# Patient Record
Sex: Male | Born: 1945 | Race: White | Hispanic: No | Marital: Married | Smoking: Former smoker
Health system: Southern US, Community
[De-identification: ages and names within clinical notes are randomized; demographics above are authoritative.]

## PROBLEM LIST (undated history)

## (undated) DIAGNOSIS — Z720 Tobacco use: Secondary | ICD-10-CM

## (undated) DIAGNOSIS — K219 Gastro-esophageal reflux disease without esophagitis: Secondary | ICD-10-CM

## (undated) DIAGNOSIS — I2510 Atherosclerotic heart disease of native coronary artery without angina pectoris: Principal | ICD-10-CM

## (undated) DIAGNOSIS — E119 Type 2 diabetes mellitus without complications: Secondary | ICD-10-CM

## (undated) DIAGNOSIS — E782 Mixed hyperlipidemia: Secondary | ICD-10-CM

## (undated) DIAGNOSIS — I739 Peripheral vascular disease, unspecified: Secondary | ICD-10-CM

## (undated) DIAGNOSIS — K5792 Diverticulitis of intestine, part unspecified, without perforation or abscess without bleeding: Secondary | ICD-10-CM

## (undated) DIAGNOSIS — I10 Essential (primary) hypertension: Secondary | ICD-10-CM

## (undated) DIAGNOSIS — E1140 Type 2 diabetes mellitus with diabetic neuropathy, unspecified: Secondary | ICD-10-CM

## (undated) DIAGNOSIS — H269 Unspecified cataract: Secondary | ICD-10-CM

## (undated) DIAGNOSIS — M1990 Unspecified osteoarthritis, unspecified site: Secondary | ICD-10-CM

## (undated) HISTORY — PX: OTHER SURGICAL HISTORY: SHX169

## (undated) HISTORY — PX: KNEE SURGERY: SHX244

---

## 2001-05-28 ENCOUNTER — Inpatient Hospital Stay (HOSPITAL_COMMUNITY): Admission: AD | Admit: 2001-05-28 | Discharge: 2001-05-31 | Payer: Self-pay | Admitting: Cardiology

## 2002-03-26 ENCOUNTER — Encounter: Payer: Self-pay | Admitting: Emergency Medicine

## 2002-03-26 ENCOUNTER — Inpatient Hospital Stay (HOSPITAL_COMMUNITY): Admission: RE | Admit: 2002-03-26 | Discharge: 2002-03-27 | Payer: Self-pay | Admitting: Internal Medicine

## 2003-07-14 ENCOUNTER — Inpatient Hospital Stay (HOSPITAL_BASED_OUTPATIENT_CLINIC_OR_DEPARTMENT_OTHER): Admission: RE | Admit: 2003-07-14 | Discharge: 2003-07-14 | Payer: Self-pay | Admitting: Cardiology

## 2004-04-08 HISTORY — PX: LEG AMPUTATION ABOVE KNEE: SHX117

## 2004-12-24 ENCOUNTER — Encounter (INDEPENDENT_AMBULATORY_CARE_PROVIDER_SITE_OTHER): Payer: Self-pay | Admitting: *Deleted

## 2004-12-24 ENCOUNTER — Ambulatory Visit: Payer: Self-pay | Admitting: Physical Medicine & Rehabilitation

## 2004-12-24 ENCOUNTER — Inpatient Hospital Stay (HOSPITAL_COMMUNITY): Admission: EM | Admit: 2004-12-24 | Discharge: 2005-01-04 | Payer: Self-pay | Admitting: *Deleted

## 2004-12-25 ENCOUNTER — Encounter: Payer: Self-pay | Admitting: Cardiology

## 2004-12-25 ENCOUNTER — Ambulatory Visit: Payer: Self-pay | Admitting: Cardiology

## 2004-12-26 ENCOUNTER — Encounter (INDEPENDENT_AMBULATORY_CARE_PROVIDER_SITE_OTHER): Payer: Self-pay | Admitting: *Deleted

## 2005-01-01 ENCOUNTER — Encounter: Payer: Self-pay | Admitting: Cardiology

## 2005-01-04 ENCOUNTER — Inpatient Hospital Stay (HOSPITAL_COMMUNITY)
Admission: RE | Admit: 2005-01-04 | Discharge: 2005-01-10 | Payer: Self-pay | Admitting: Physical Medicine & Rehabilitation

## 2005-01-18 ENCOUNTER — Ambulatory Visit: Payer: Self-pay | Admitting: Physical Medicine & Rehabilitation

## 2005-01-18 ENCOUNTER — Inpatient Hospital Stay (HOSPITAL_COMMUNITY): Admission: AD | Admit: 2005-01-18 | Discharge: 2005-01-25 | Payer: Self-pay | Admitting: Vascular Surgery

## 2005-01-21 ENCOUNTER — Encounter (INDEPENDENT_AMBULATORY_CARE_PROVIDER_SITE_OTHER): Payer: Self-pay | Admitting: Specialist

## 2005-01-28 ENCOUNTER — Inpatient Hospital Stay (HOSPITAL_COMMUNITY): Admission: EM | Admit: 2005-01-28 | Discharge: 2005-02-14 | Payer: Self-pay | Admitting: Emergency Medicine

## 2005-01-28 ENCOUNTER — Ambulatory Visit: Payer: Self-pay | Admitting: Family Medicine

## 2005-04-05 ENCOUNTER — Ambulatory Visit: Payer: Self-pay | Admitting: Physical Medicine & Rehabilitation

## 2005-04-05 ENCOUNTER — Encounter
Admission: RE | Admit: 2005-04-05 | Discharge: 2005-07-04 | Payer: Self-pay | Admitting: Physical Medicine & Rehabilitation

## 2005-05-30 ENCOUNTER — Ambulatory Visit: Payer: Self-pay | Admitting: Physical Medicine & Rehabilitation

## 2005-07-10 ENCOUNTER — Encounter
Admission: RE | Admit: 2005-07-10 | Discharge: 2005-08-29 | Payer: Self-pay | Admitting: Physical Medicine & Rehabilitation

## 2005-08-07 ENCOUNTER — Ambulatory Visit: Payer: Self-pay | Admitting: Cardiology

## 2006-04-08 HISTORY — PX: CHOLECYSTECTOMY: SHX55

## 2007-03-31 IMAGING — CR DG HIP COMPLETE 2+V*R*
3 series · 3 of 3 positions shown · non-contrast
Comparison: none

CLINICAL DATA: Severe right hip pain with recent right leg amputation. 
 3-VIEW RIGHT HIP:

[t pelvis a.p.]
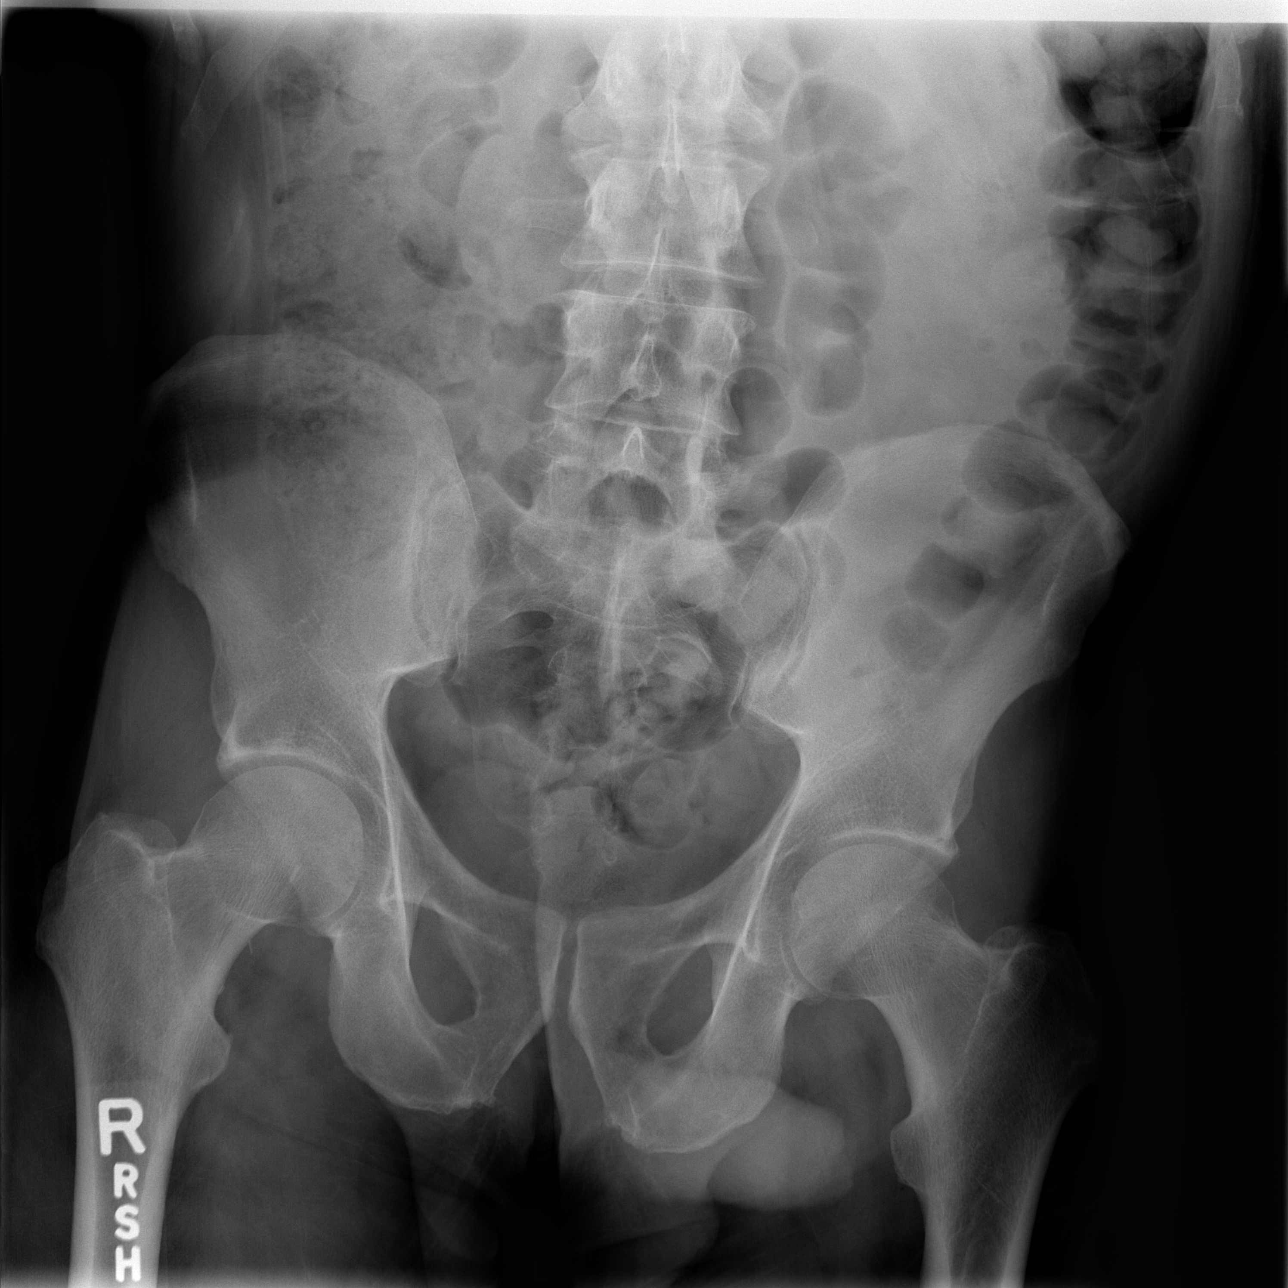

[t hip ap right]
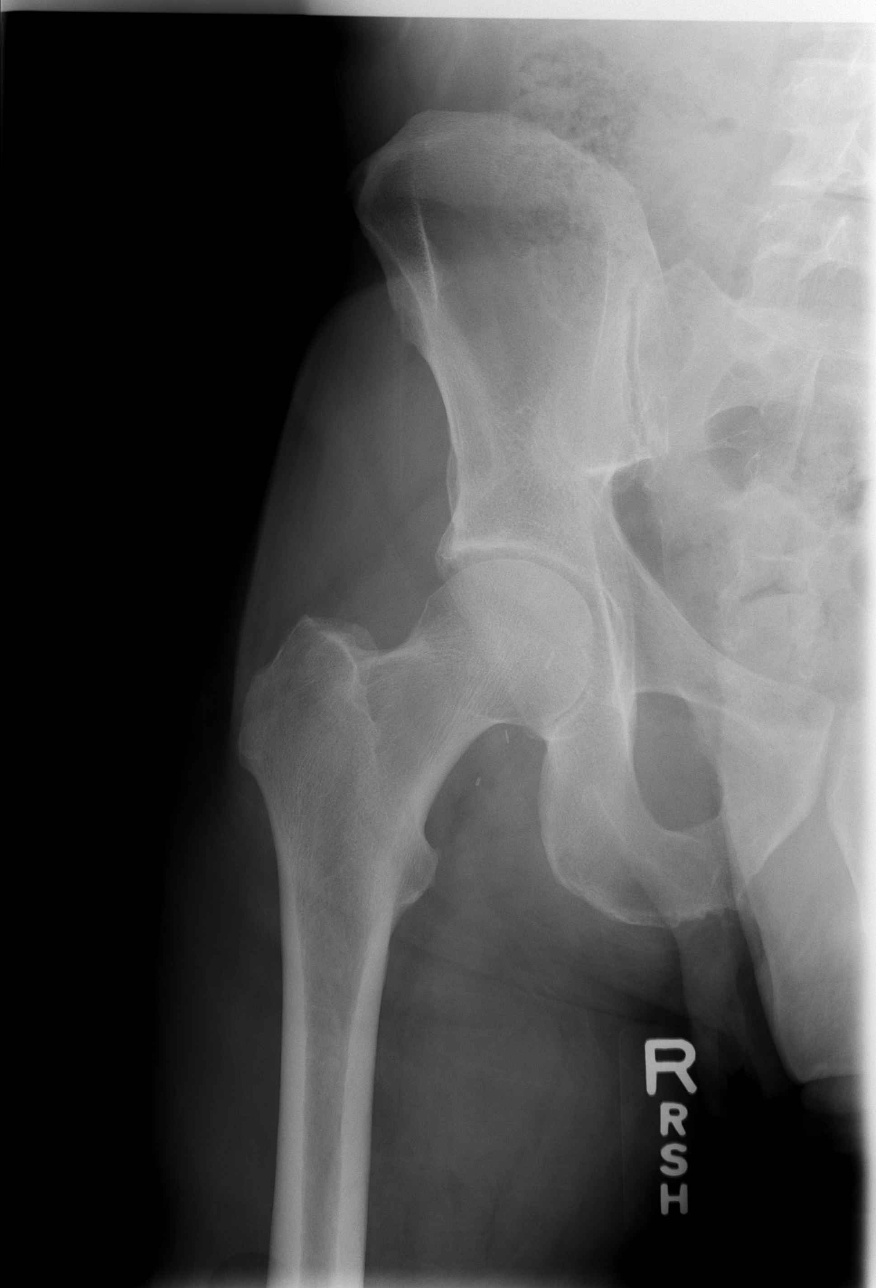

[t hip frog leg right]
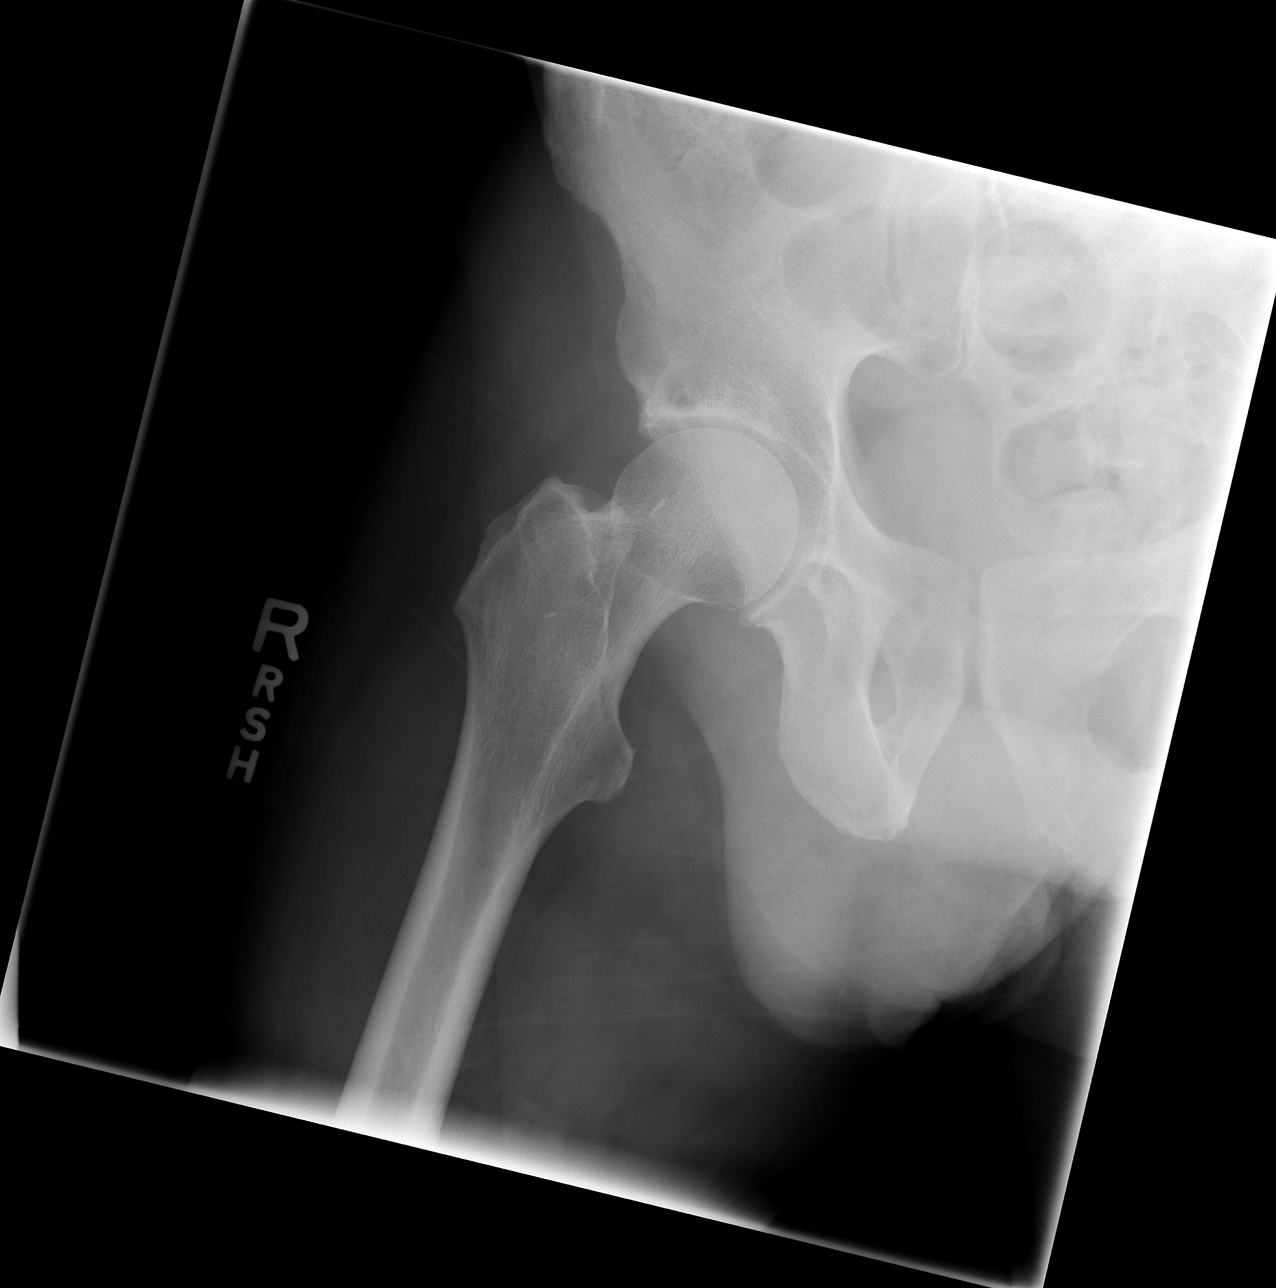

[3 of 3 positions shown; findings below may reference images not displayed]

FINDINGS: Hips are unremarkable.  Obturator rings are intact.
IMPRESSION: No acute findings.

## 2008-03-24 ENCOUNTER — Ambulatory Visit: Payer: Self-pay | Admitting: Cardiovascular Disease

## 2010-08-21 NOTE — Assessment & Plan Note (Signed)
[facility]                            CARDIOLOGY OFFICE NOTE   Marvin Nelson, Marvin Nelson                        MRN:          [id number]  DATE:03/24/2008                            DOB:          1946-01-18    REFERRED BY:  Wyvonnia Lora   Marvin Nelson is a 65 year old patient referred by Dr. Margo Common.  The patient  is from [place] and is previous seen by Dr. Diona Browner.  He was not quite  sure why he had to come down to [place], although Dr. Jackolyn Confer note  indicated that he wanted someone in [place] to be familiar with him,  as the patient needs oral surgery, as far as I know this is being done  by Dr. Barbette Merino, but it is an outpatient procedure.   The patient is on chronic Coumadin back in 2003.  He had an emboli to  the right lower extremity and has been on Coumadin since.  The patient  was off Coumadin in 2006 without a Lovenox overlap for gallbladder  surgery and had no sequela.  There is no documentation in e-chart  regarding any hypercoagulability state.  There was mention that the  patient had a TEE with no source of embolus.   The patient clinically has no previously documented coronary disease.  He did have a heart cath back in 2005 by Dr. Samule Ohm, which showed a  reversible defect which was thought to be spasm in the distal LAD, but  no fixed epicardial coronary disease.  He has had normal LV function.   Despite having a right above-knee amputation, the patient gets around  with a walker.  He has not had any significant chest pain, PND, or  orthopnea.   He has not had any other embolic events.   His review of systems is remarkable for somewhat sporadic control of his  diabetes, although he tells me his hemoglobin A1c is under 6.  He is not  having any significant palpitations, PND, or orthopnea.   His past medical history is remarkable for previous left knee surgery,  knee amputation in the right leg by Dr. Hart Rochester, and previous   cholecystectomy.   He denies any allergies.   MEDICATIONS:  1. Januvia 50 a day.  2. Metformin 500 b.i.d.  3. Glipizide 5 a day.  4. Amitriptyline 50 at bedtime.  5. Diovan hydrochlorothiazide 80/12.5.  6. Omeprazole 20 a day.  7. Gabapentin 300 two tablets 4 times a day.  8. An aspirin a day.  9. Meloxicam 15 a day.  10.Metoprolol 25 b.i.d.  11.Warfarin.   FAMILY HISTORY:  Noncontributory.   The patient is a previous alcoholic.  He is separated, but however, his  ex-wife was with him today and they seem to get along fine.  He has 1  child.  He is on disability.  He currently does not smoke or do drugs or  drink alcohol.   He quit smoking a few weeks ago or at least he says he did.   PHYSICAL EXAMINATION:  GENERAL:  Remarkable for disheveled male in no  distress.  VITAL SIGNS:  His blood pressure is 130/70, pulse is 80 and regular with  no evidence of fibrillation, weight is 146.  HEENT:  Unremarkable.  NECK:  Carotids are normal without bruit.  No lymphadenopathy,  thyromegaly, or JVP elevation.  LUNGS:  Clear with good diaphragmatic motion.  No wheezing.  CARDIAC:  S1 and S2 with the faint systolic murmur.  PMI normal.  ABDOMEN:  Benign.  Bowel sounds positive.  No AAA, no bruit, no bruit,  no hepatosplenomegaly, no hepatojugular reflux, or tenderness.  EXTREMITIES:  Distal pulses in the left leg are palpable.  He has no  obvious bruits.  He is status post right above-knee amputation on the  left.   Baseline EKG is normal.   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  At that time, the workup was apparently  negative including a TEE for source of embolus.  He has been off his  Coumadin already once before for cholecystectomy.  I see no reason why  he cannot come off his Coumadin and have his teeth pulled.  I do not  think he needs a Lovenox bridge for this.  He will make plans to go  ahead and do this with  Dr. Barbette Merino.  I think, we should take advantage of  his period of being off Coumadin as to check a hypercoagulability panel  including a protein S, protein C, factor V on Leiden, anticardiolipin,  and lupus anticoagulant panel.  This will allow [place] to possibly see  whether he really does need to be on Coumadin the rest of his life.  1. History of hypertension, currently well controlled.  Continue      current medications.  Low-sodium diet.  2. Diabetes.  Continue current oral hypoglycemics.  Hemoglobin A1c      seems to be in a good range.  3. History of arthritis.  Continue meloxicam.  4. History of reflux.  Continue omeprazole.  5. 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  Continue      amitriptyline.   The patient has previously been seen by Dr. Diona Browner.  He wants to be  followed in [place].  We will make arrangements for him to get his  hypercoagulability panel after his teeth is being pulled in [place] and  have him follow up with Dr. Diona Browner in [place].     Noralyn Pick. Eden Emms, MD, [facility]  Electronically Signed    PCN/MedQ  DD: 03/24/2008  DT: 03/25/2008  Job #: [id number]

## 2010-08-24 NOTE — Consult Note (Signed)
NAME:  Marvin Nelson, Marvin Nelson                           ACCOUNT NO.:  [id number]   MEDICAL RECORD NO.:  [id number]                   PATIENT TYPE:  INP   LOCATION:  A210                                 FACILITY:  [facility]   PHYSICIAN:  [place] Bing, M.D. [facility]           DATE OF BIRTH:  Apr 04, 1946   DATE OF CONSULTATION:  03/26/2002  DATE OF DISCHARGE:                                   CONSULTATION   PRIMARY CARDIOLOGIST:  Jonelle Sidle, M.D. [facility]   PRIMARY PHYSICIAN:  Zigmund Gottron, M.D.   HISTORY OF PRESENT ILLNESS:  A 65 year old gentleman with known mild  coronary artery disease presents with recurrent chest pain and left arm  pain.  The patient's cardiac history dates to earlier this year when he  presented with episodic left arm pain that radiated to his shoulder and to  the chest.  A stress Cardiolite study was negative for ischemia.  Coronary  angiography revealed mild to moderate LAD disease including a 70% distal  lesion and a 50% proximal RCA lesion.  The left ventricular systolic  function was normal.  Since that time, he has continued to have episodic  left arm and chest discomfort, some episodes being moderately severe.  Some  of his description is fairly good for anginal discomfort.  Episode generally  occur a few times per month and are promptly relieved with sublingual  nitroglycerin.  While awaiting upper and lower endoscopy today, he noted the  onset of his typical symptoms.  He was transferred to the emergency  department where discomfort was relieved by sublingual nitroglycerin.  He is  now admitted to rule out myocardial infarction.   The patient has significant vascular risk factors including diabetes,  hypertension, and hyperlipidemia.  There is also a history of COPD.   SOCIAL HISTORY:  He lives in [place] with his wife; disabled from work as a  Surveyor, minerals.  He is married with no children.  He has a 40-pack-year of  cigarette smoking that was discontinued one year  ago.  There is a remote  history of alcohol abuse.   FAMILY HISTORY:  Mother had diabetes; father had a fatal myocardial  infarction in his 39s.   REVIEW OF SYSTEMS:  He does have some DJD with arthralgias in the shoulder,  neck, and back.  He had lately had abdominal pain for which Dr. Jena Gauss is  currently evaluating him.  All other symptoms negative.   CURRENT MEDICATIONS:  1. Metformin 1500 mg q.d.  2. Celebrex 200 mg q.d.  3. Triamterene/HCTZ 1 q.d.  4. Atenolol 100 mg q.d.  5. Prevacid 30 mg q.d.  6. Aspirin 81 mg q.d.  7. Welchol 3.75 g q.d.  8. Allegra 180 mg q.d.  9. Nitroglycerin p.r.n.   PHYSICAL EXAMINATION:  GENERAL:  A pleasant well-appearing gentleman.  VITAL SIGNS:  The heart rate is 70 and regular, respirations 16, blood  pressure 150/70.  NECK:  No jugular venous distention; no carotid bruits.  ENDOCRINE:  No thyromegaly.  SKIN:  No significant lesions.  HEENT:  Anicteric sclerae.  LUNGS:  Clear.  CARDIAC:  Normal first and second heart sounds.  ABDOMEN:  Soft and nontender; no organomegaly.  EXTREMITIES:  Distal pulses intact; no edema.  NEUROMUSCULAR:  Symmetric strength and tone.   LABORATORY DATA:  EKG with sinus bradycardia; nonspecific T-wave  abnormality.   Initial laboratory unremarkable including CPK-MB of 2.8 and troponin of  0.02.   IMPRESSION:  The patient has mild coronary disease with symptoms that are  not clearly related to myocardial ischemia.  The episode that occurred this  morning is no different than multiple episodes that have been treated at  home in the past.  If cardiac markers remain negative, and the EKG is  unchanged in the morning, he can be discharged without further evaluation.  I agree with the decision to add amlodipine to his medical regimen.  If this  is ineffective, isosorbide mononitrate would be my next choice.  Consideration could be given to chronic treatment with an ACE inhibitor in  light of his coronary disease  and diabetes as well.  I have suggested that  he schedule an appointment with Dr. Diona Browner in approximately one month for  clinical reevaluation and further adjustment of medication.   We greatly appreciate the opportunity afforded to [place] to reassess this nice  gentleman and assist in his care in hospital.                                               [place] Bing, M.D. [facility]    RR/MEDQ  D:  03/26/2002  T:  03/27/2002  Job:  [id number]

## 2010-08-24 NOTE — Assessment & Plan Note (Signed)
INTERVAL HISTORY:  Marvin Nelson is back regarding his right above-knee amputation.  He is back for wound followup.  The wound has healed up nicely.  We had  recommended some wet-to-dry dressings.  Dr. Darrick Penna gave him a positive  checkup as well recently.  He is still having some left leg pain from the  back into the posterior thigh and calf.  The phantom pain is improving.  Overall he rates his pain at 5/10 described as burning and sharp.  The  patient remains on Neurontin 200 mg twice daily and 600 mg at bedtime as  well as Elavil 50 mg at bedtime.  The patient is eager to be fitted with his  prosthetic leg.   REVIEW OF SYSTEMS:  The patient reports weight gain, easy bleeding, labile  sugars.  Otherwise pertinent positives are listed above and full review is  in the health and history section of the chart.   SOCIAL HISTORY:  The patient is married and living with his spouse.   PHYSICAL EXAMINATION:  Blood pressure is 125/70, pulse 82, respiratory rate  16, he is saturating 97% in room air.  The patient is pleasant.  No acute  distress.  He is alert, oriented x3.  He is hopping on his walker and doing  fairly well.  The patient had some pain with palpation of the left lumbar  spine.  Seated slump test was equivocal.  Right leg was clean and generally  intact.  I did press deep into the invaginated area of the distal limb which  appeared to be granulated.  There was a small area that bled minimally with  deep palpation.  No purulent material or discharge was noted.  Heart was  regular rate and rhythm.  Lungs are clear.  Abdomen soft, nontender.   ASSESSMENT:  1.  Right above-knee amputation.  2.  Diabetes.  3.  Left leg pain consistent with lumbar radiculopathy.   PLAN:  1.  Even though there is a tiny area that had some bloody discharge I think      he is appropriate to begin fitting of his prosthesis.  He will need to      watch the leg closely in the socket for tolerance.  There is no  dressing      needed currently other than keeping the area clean.  2.  We will follow up with the patient in approximately 2 months' time.  I      will send him to therapy once he is fitted for this limb.  Consider      workup for his back depending on how he does once he begins walking on      his right prosthetic limb.      Ranelle Oyster, M.D.  Electronically Signed     ZTS/MedQ  D:  05/31/2005 11:54:18  T:  05/31/2005 19:39:28  Job #:  [id number]   cc:   Janetta Hora. Fields, MD  [address], [place] [address]

## 2010-08-24 NOTE — H&P (Signed)
Marvin Nelson, Marvin Nelson                 ACCOUNT NO.:  [id number]   MEDICAL RECORD NO.:  [id number]          PATIENT TYPE:  EMS   LOCATION:  MAJO                         FACILITY:  [facility]   PHYSICIAN:  Quita Skye. Hart Rochester, M.D.  DATE OF BIRTH:  Dec 02, 1945   DATE OF ADMISSION:  12/24/2004  DATE OF DISCHARGE:                                HISTORY & PHYSICAL   CHIEF COMPLAINT:  Right foot pain.   HISTORY OF PRESENT ILLNESS:  This is a 65 year old male with acute  presentation to [facility] with pain in his right foot.  Approximately 11:30 today, he states he had acute episode while doing some  yard work where the foot became quite painful.  Additionally, there were  symptoms of numbness, increasing coldness, and some tingling sensation.  He  maintained gross motor and sensation to the foot but presented to the  [facility] where initial impression was profound ischemia to the  right lower extremity.  Vascular surgical opinion was requested and  telephone consultation with Dr. Hart Rochester, and he recommended transfer to [facility] for further evaluation and treatment.   PAST MEDICAL HISTORY:  1.  Hypertension.  2.  Diabetes mellitus, type 2.  3.  Chronic low back pain.  4.  Coronary artery disease.  He has had some kind of previous procedure,      but he is not sure if it was a stent or angioplasty or just      catheterization.  5.  Tobacco abuse.   PAST SURGICAL HISTORY:  Status post left knee arthroscopy.   ALLERGIES:  No known drug allergies.   SOCIAL HISTORY:  He is a long-term smoker of approximately 50 years.  He  smokes about 1 pack per day.  He quit at one time for approximately 8 years  duration.  Alcohol use is none for the past 13 years.  He has drunk heavy in  the past. Drug use: None recently.  He admits to remote marijuana use.   FAMILY HISTORY:  Noncontributory.   MEDICATIONS:  1.  Diclofenac 75 mg twice daily.  2.  Welchol 625 mg 3 tablets twice  daily.  3.  Glucophage 2000 mg daily.  4.  Neurontin 300 mg in the a.m., 300 mg at noon, and 600 mg at bedtime.  5.  Prevacid 30 mg daily.  6.  Amitriptyline 50 mg nightly.  7.  Diovan 160/25 mg 1 daily.  8.  Aspirin 81 mg daily.   REVIEW OF SYMPTOMS:  Remarkable for shortness of breath and dyspnea on  exertion.  He denies frank chest pain, palpitations, pedal edema, PND, or  orthopnea.  He does have a history of hypertension.  Review of Symptoms is  otherwise unremarkable on full system review, although he does admit to  chronic low back pain that is quite disabling.   PHYSICAL EXAMINATION:  GENERAL: He is alert and in no acute distress.  HEENT:  Pupils equal, round, and reactive to light.  Extraocular movements  intact. Sclerae anicteric.  Pharynx is clear without exudates or erythema.  He  is nearly edentulous.  He does have a couple of teeth in fair repair.  NECK:  Supple.  No jugular venous distention, no bruits, no adenopathy, no  thyromegaly.  PULMONARY:  Clear to auscultation without rales, rhonchi, or wheeze.  CARDIAC:  Regular rate and rhythm.  S1, S2 without murmurs, gallops, or  rubs.  ABDOMEN:  Soft, nontender.  No masses, no bruits, no organomegaly.  EXTREMITIES:  The right foot is cool, pale, pulseless.  The left foot has a  posterior tibial pulse.  He has a weak right femoral pulse.  He has a strong  left femoral pulse. Gross motor and sensory exam is intact.  He does move  his toes, but the foot is cool to approximately the level of the ankle.  NEUROLOGIC:  Otherwise nonfocal.  SKIN:  Normal turgor, no lesions.  The right foot is pale.   ASSESSMENT:  Acute right lower extremity ischemia.   PLAN:  Arteriogram and then possible operative intervention pending findings  during the arteriogram.      Rowe Clack, P.A.-C.    ______________________________  Quita Skye. Hart Rochester, M.D.    Sherryll Burger  D:  12/24/2004  T:  12/24/2004  Job:  [id number]

## 2010-08-24 NOTE — Discharge Summary (Signed)
Marvin Nelson, Marvin Nelson                 ACCOUNT NO.:  [id number]   MEDICAL RECORD NO.:  [id number]          PATIENT TYPE:  IPS   LOCATION:  4037                         FACILITY:  [facility]   PHYSICIAN:  Ranelle Oyster, M.D.DATE OF BIRTH:  18-Sep-1945   DATE OF ADMISSION:  01/04/2005  DATE OF DISCHARGE:  01/10/2005                                 DISCHARGE SUMMARY   DISCHARGE DIAGNOSES:  1.  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  Pain management.  3.  Chronic Coumadin therapy for peripheral vascular disease.  4.  Hypertension.  5.  Type 2 diabetes mellitus.  6.  Hyperlipidemia.  7.  Coronary artery disease.  8.  Chronic low back pain.  9.  Tobacco abuse.  10. Hyponatremia, resolved.   HISTORY OF PRESENT ILLNESS:  A 65 year old white male admitted to [facility] with acute right foot pain, profound ischemic changes.  Angiogram  showed a large embolus in the terminal aorta partially obstructing both  iliac anterior with no flow down any of the tibial vessels below the knee.  Underwent bilateral iliofemoral embolectomies with direct embolectomy  December 24, 2004 by Dr. Hart Rochester with ultimate right below-knee amputation  same day.  Echocardiogram with ejection fraction 55-65% without thrombus.  Placed on Coumadin therapy after below-knee amputation for peripheral  vascular disease.   HOSPITAL COURSE:  With hyponatremia, 125, improved after hydrochlorothiazide  discontinued to 135.  Follow-up TEE after echocardiogram January 01, 2005  without findings of thrombus.  Noted fall on January 01, 2005 and  amputation site bleeding, cleaned, cleansed daily.  No surgical  intervention.  Admitted for a comprehensive rehabilitation program.   PAST MEDICAL HISTORY:  See discharge diagnoses.  Noted history of tobacco  abuse.  Remote alcohol as well as marijuana.   SOCIAL HISTORY:  He lives with his wife in [place].  Wife works day  shift.  The  patient on Disability.  Local family check as needed.  One level home, two  steps to entry.   ALLERGIES:  None.   MEDICATIONS PRIOR TO ADMISSION:  1.  Diclofenac 75 mg twice daily.  2.  Welchol 625 mg three tablets twice daily.  3.  Glucophage 2000 mg daily.  4.  Neurontin 300 mg twice daily and 600 mg at bedtime.  5.  Prevacid 30 mg daily.  6.  Elavil 50 mg at bedtime.  7.  Diovan 160 mg - 25 mg daily.  8.  Aspirin 81 mg daily.   HOSPITAL COURSE:  The patient with progressive gains while on rehabilitation  services with therapies initiated on a b.i.d. basis. The following issues  are followed during the patient's rehabilitation course:  Pertaining to Marvin Nelson's right below-knee amputation, surgical site healing nicely.  No signs  of infection.  He would follow up with Dr. Hart Rochester two weeks after discharge.  Pain control ongoing with the use of OxyContin Sustained Release 20 mg every  12 hours with taper is advised as well as oxycodone for breakthrough pain  and Neurontin.  He remained on chronic Coumadin therapy for peripheral  vascular disease secondary to thrombus that inevitably led to his below-knee  amputation.  He will continue on Coumadin therapy with no bleeding episodes.  Latest INR of 1.6 and Dr. Wyvonnia Lora to continue to follow.  His blood  pressures remained monitored with diastolic's 56-64 on multiple  antihypertensive medications of Toprol, Norvasc and Diovan.  His blood  sugars varied at 131, 121, 137 with Glucophage as well as diabetic diet.  He  had no chest pain or shortness of breath throughout his rehabilitation  course.  He did have a noted history of tobacco abuse with a nicotine patch  in place.  It was discussed at length the need for cessation smoking.  It  was questionable if he would be compliant with these requests.   Latest laboratories showed an INR of 1.6, hemoglobin of 11, hematocrit 31.7,  platelets 624,000.  Sodium 137, potassium  4.2, BUN 20, creatinine 1.0.  He  was ambulating short distances with a walker, needing minimal assist for  lower body activities of daily living.  Overall, his strength and endurance  had greatly improved as he was encouraged with his overall progress and  discharged to home.   DISCHARGE MEDICATIONS AT TIME OF DICTATION:  1.  Coumadin 6 mg daily.  2.  Neurontin 300 mg twice daily and 600 mg at bedtime.  3.  Prevacid 30 mg daily.  4.  Elavil 50 mg at bedtime.  5.  Aspirin 81 mg daily.  6.  Welchol twice daily.  7.  Toprol XL 25 mg daily.  8.  Glucophage 500 mg two tablets in the a.m., one tablet in the p.m.  9.  Norvasc 5 mg daily.  10. Diovan 160 mg daily.  11. OxyContin Sustained Release 10 mg two tablets twice daily x1 week, then      one tablet twice daily x1 week and stop.  12. Oxycodone Immediate Release for breakthrough pain.   DIET:  Diabetic diet.   He would follow with Dr. Riley Kill at the outpatient rehabilitation service  amputee clinic, Dr. Hart Rochester two weeks after discharge, Dr. Margo Common medical  management.  His Coumadin will continue to be followed by Dr. Margo Common of  [place], [facility] who will be contacted prior to the patient's departure  from the hospital.      Mariam Dollar, P.A.      Ranelle Oyster, M.D.  Electronically Signed    DA/MEDQ  D:  01/09/2005  T:  01/09/2005  Job:  [id number]   cc:   Ranelle Oyster, M.D.  Fax: [phone]   Quita Skye. Hart Rochester, M.D.  [address]  [place]  [place] [address]   Wyvonnia Lora  Fax: [phone]   [facility]

## 2010-08-24 NOTE — Discharge Summary (Signed)
NAME:  Marvin Nelson, Marvin Nelson                           ACCOUNT NO.:  [id number]   MEDICAL RECORD NO.:  [id number]                   PATIENT TYPE:  INP   LOCATION:  A210                                 FACILITY:  [facility]   PHYSICIAN:  Hanley Hays. Dechurch, M.D.           DATE OF BIRTH:  05/15/1945   DATE OF ADMISSION:  03/26/2002  DATE OF DISCHARGE:  03/27/2002                                 DISCHARGE SUMMARY   DISCHARGE DIAGNOSES:  1. Chest pain, probable noncardiac.  2. Coronary artery disease, mild with history of coronary angiography with     mild LAD disease in February 2003 followed by a stress Cardiolite which     was negative for ischemia.  3. Diabetes mellitus.  4. Degenerative joint pain.  5. Probable reflux.  6. Increased transaminases, probable fatty liver infiltration.  7. Chronic obstructive pulmonary disease with history of tobacco abuse.  8. Remote history of alcohol abuse.  9. Hypertension.  10.      Hyperlipidemia.  11.      Disability secondary to chronic back pain.   DISPOSITION:  Discharged to home.   DISCHARGE MEDICATIONS:  1. Norvasc 5 mg daily which is the only new addition to his regimen.  2. Glucophage XR 1500 mg with supper.  3. Coated aspirin 81 daily.  4. Dyazide daily.  5. Celebrex daily 200 mg.  6. Allegra 180 mg.  7. Prevacid 30 daily.  8. Atenolol 100 daily.  9. Welchol 350 daily.  10.      Nitroglycerin 0.4 mg sublingual as needed for chest pain or arm     pain.   DISCHARGE INSTRUCTIONS:  Follow up with Dr. Diona Browner in 1 month.  Follow up  with Dr. Annia Belt as scheduled.  The patient was advised to come to the  emergency room if the pain persisted.   HISTORY OF PRESENT ILLNESS:  The patient is a 65 year old gentleman with  known coronary artery disease with he evaluation as noted above.   HOSPITAL COURSE:  He presented to the emergency room with chest pain which  occurred at rest.  It appeared to disappear with nitroglycerin, but  persisted  through the morning and he presented for further evaluation. His  enzymes remained negative.  His EKG revealed no acute ischemic changes.  He  was seen in consultation with cardiology who agreed to the plan of discharge  with medical regimen as noted above and follow up for possible addition of  ACE inhibitor to his regimen which would be reasonable given his history of  diabetes mellitus.   The patient had no further pain during his hospital stay.  He was discharged  to home in stable condition with follow up as noted above.  Hanley Hays Josefine Class, M.D.    FED/MEDQ  D:  05/05/2002  T:  05/05/2002  Job:  [id number]   cc:   Dr. Evern Bio  [place]   Jonelle Sidle, M.D. [facility]  [address]  [place]  [place] [address]  Fax: [phone]

## 2010-08-24 NOTE — Consult Note (Signed)
Marvin Nelson, Marvin Nelson                 ACCOUNT NO.:  [id number]   MEDICAL RECORD NO.:  [id number]          PATIENT TYPE:  INP   LOCATION:  2550                         FACILITY:  [facility]   PHYSICIAN:  Charlton Haws, M.D.     DATE OF BIRTH:  October 23, 1945   DATE OF CONSULTATION:  12/25/2004  DATE OF DISCHARGE:                                   CONSULTATION   Consult for embolus, question cardiac source.   Marvin Nelson is a 65 year old patient of Dr. Diona Browner.  He has also been seen  by Dr. Dietrich Pates and most recently catheterized in 2005 by Dr. Samule Ohm.   The patient has multiple coronary risk factors including diabetes, smoking,  positive family history.   There is a distant history of alcohol abuse.   The patient was admitted to the hospital from [facility] semi-urgently with  acute onset of right foot pain and ischemia.  He is status post embolectomy  by Dr. Hart Rochester.  I saw him in the PACU.  He is somewhat groggy from his  anesthesia.  He still continues to have significant right lower extremity  pain.  In talking to the patient he has not had any significant chest pain.  His last catheterization showed a reversible 70% lesion in the distal LAD  that may have represented spasm.  He had no critical epicardial stenoses in  the other vessels.  He had normal LV function and has never had an MI or  wall motion abnormality.  In talking to the patient he does not have  syncope, palpitations, or history of atrial fibrillation.   He noted acute onset of right foot pain yesterday while working.   The patient is married.  He does not have children.  He lives with his wife  in [place] area.  He is disabled.   MEDICATIONS:  1.  Diclofenac 75 b.i.d.  2.  Welchol.  3.  Metformin 500 mg four daily.  4.  Gabapentin 300 mg in the morning, noon, and night.  5.  Prevacid 30 mg a day.  6.  Amitriptyline.  7.  Amlodipine 10 mg a day.  8.  Diovan 160/25.  9.  Aspirin a day.   Postoperatively he is doing well.   He had stable blood pressures and heart  rates during his operation.   Postoperatively he has been placed on heparin and the rate has recently been  increased.  He has no known allergies.   PHYSICAL EXAMINATION:  GENERAL:  He is a bit groggy.  VITAL SIGNS:  Blood pressure 150/80, pulse 88 and regular with an occasional  PVC.  LUNGS:  Clear.  HEART:  Carotids are normal.  There is an S1, S2 with normal heart tones.  ABDOMEN:  Benign.  He has a dopplerable DP on the right with the Al Pimple drain coming out of the calf area.   EKG shows sinus rhythm without acute changes.   Other laboratory work is pending.   IMPRESSION:  Marvin Nelson appears to have had an acute embolic event to his  right lower extremity.  His  biggest risk factor is smoking.  There has been  no history of evidence of any arrhythmia or atrial fibrillation.   He will have a 2-D echocardiogram in the morning as an initial assessment of  wall motion abnormality.   Before he goes home he should probably have a TEE.  I suspect this can wait  two or three days until he has recovered from his surgery a little bit.   He will be maintained on heparin and transitioned to Coumadin per the  vascular surgeons.   He can follow up with Dr. Diona Browner in [place] as an outpatient most likely to  have an outpatient Myoview study.   Further recommendations to be based on the results of his echocardiogram.           ______________________________  Charlton Haws, M.D.     PN/MEDQ  D:  12/25/2004  T:  12/25/2004  Job:  [id number]

## 2010-08-24 NOTE — Op Note (Signed)
NAMEBRENTON, Marvin Nelson                 ACCOUNT NO.:  [id number]   MEDICAL RECORD NO.:  [id number]          PATIENT TYPE:  INP   LOCATION:  3308                         FACILITY:  [facility]   PHYSICIAN:  Quita Skye. Hart Rochester, M.D.  DATE OF BIRTH:  [birth year]   DATE OF PROCEDURE:  12/26/2004  DATE OF DISCHARGE:                                 OPERATIVE REPORT   PREOPERATIVE DIAGNOSIS:  Severe ischemic pain, right foot, following  bilateral iliofemoral embolectomy and right tibial embolectomies.   POSTOPERATIVE DIAGNOSIS:  Severe ischemic pain, right foot, following  bilateral iliofemoral embolectomy and right tibial embolectomies.   OPERATIONS:  Right below-knee amputation.   SURGEON:  Quita Skye. Hart Rochester, M.D.   FIRST ASSISTANT:  Pecola Leisure, PA   ANESTHESIA:  General endotracheal.   PROCEDURE:  The patient was taken to the operating room and placed in a  supine position, at which time satisfactory general endotracheal anesthesia  was administered.  The right leg was prepped with Betadine scrub and  solution and draped in a routine sterile manner.  Skin flaps were marked for  a below-knee amputation with the posterior flap being twice the length of  the anterior flap, basing the tibia and fibula about 3-4 inches distal to  the knee joint.  Incision was carried down through skin and subcutaneous  tissue and incorporated into the previous medial popliteal exploration  wound.  The tibia was cleaned proximally with a periosteal elevator, divided  with the striker saw, beveling it anteriorly and smoothing it with a rasp  and fibula was also divided an equal length.  The popliteal and tibial  vessels were individually ligated with 2-0 silk ties and ligatures and the  posterior muscles divided with the Bovie, and the specimen removed from the  table.  Adequate hemostasis was achieved.  Because of edema in the muscle,  it was somewhat difficult to close the stump, but this was accomplished,  leaving a medium Hemovac drain in the depth of the wound, brought out  medially and laterally, and secured with silk sutures.  The wound was closed  in layers with 2-0 Vicryl for the fascia and continuous 2-0 Vicryl for the  subcu and clips for the skin.  A sterile dressing was applied consisting of  4 x 4's, Kerlix, ABD pads and a knee immobilizer and the patient taken to  the recovery room in satisfactory condition.           ______________________________  Quita Skye Hart Rochester, M.D.     JDL/MEDQ  D:  12/26/2004  T:  12/27/2004  Job:  [id number]

## 2010-08-24 NOTE — Op Note (Signed)
NAMELARY, ECKARDT                 ACCOUNT NO.:  [id number]   MEDICAL RECORD NO.:  [id number]          PATIENT TYPE:  INP   LOCATION:  5023                         FACILITY:  [facility]   PHYSICIAN:  Janetta Hora. Fields, MD  DATE OF BIRTH:  02-19-1946   DATE OF PROCEDURE:  02/11/2005  DATE OF DISCHARGE:                                 OPERATIVE REPORT   PROCEDURE:  Revision of right above-knee amputation.   PREOPERATIVE DIAGNOSIS:  Open right above-knee amputation.   POSTOPERATIVE DIAGNOSIS:  Open right above-knee amputation.   ANESTHESIA:  General.   ASSISTANT:  Nurse   FINDINGS:  1.  Clean tissues right above-knee amputation.  2.  Penrose drain.   OPERATIVE DETAIL:  After obtaining informed consent, the patient was taken  to the operating room. The patient was placed in supine position on  operating table. After induction of general anesthesia the patient's right  lower extremity was prepped and draped in usual sterile fashion. The wound  was inspected, found to be clean without any obvious evidence of infection.  Next a periosteal elevator was used to raise the periosteum on the femur  approximately 5 cm higher so that the tip would not press against the skin  closure. Bone was transected at this level. A rasp was then used to follow  the anterior edge smoothed. Next the wound was thoroughly irrigated with 2  liters of normal saline solution. A 2-0 Vicryl suture was used in a running  fashion to close the deep layers. The skin was then closed with interrupted  2-0 nylon vertical mattress sutures. A Penrose drain was placed in the deep  tissues and secured to the skin with a nylon stitch. The patient tolerated  procedure well and there were no complications.  Needle, sponge, and  instrument counts correct at end of the case. The patient was taken to the  recovery room in stable condition.           ______________________________  Janetta Hora Fields, MD     CEF/MEDQ  D:   02/11/2005  T:  02/11/2005  Job:  [id number]

## 2010-08-24 NOTE — Op Note (Signed)
NAMEIVY, MERIWETHER                 ACCOUNT NO.:  [id number]   MEDICAL RECORD NO.:  [id number]          PATIENT TYPE:  INP   LOCATION:  2550                         FACILITY:  [facility]   PHYSICIAN:  Quita Skye. Hart Rochester, M.D.  DATE OF BIRTH:  July 02, 1945   DATE OF PROCEDURE:  12/24/2004  DATE OF DISCHARGE:                                 OPERATIVE REPORT   PREOPERATIVE DIAGNOSIS:  Profoundly ischemic right foot secondary to  multiple emboli to terminal aorta and tibial vessels right leg.   POSTOPERATIVE DIAGNOSIS:  Profoundly ischemic right foot secondary to  multiple emboli to terminal aorta and tibial vessels right leg plus severe  distal tibial occlusive disease.   OPERATION:  1.  Bilateral iliofemoral embolectomies.  2.  Exploration of right popliteal artery with transpopliteal embolectomy of      anterior tibial, posterior tibial, and peroneal arteries.  3.  Intraoperative arteriogram.  4.  Direct embolectomy of posterior tibial artery via posterior tibial      approach.  5.  Exploration of distal right anterior tibial artery at the ankle with      attempted thrombectomy.  6.  Papaverine infusion of distal tibial vessels.   SURGEON:  Dr. Hart Rochester.   ASSISTANT:  Jerold Coombe, P.A.   ANESTHESIA:  General endotracheal.   BRIEF HISTORY:  This patient presented having been referred from the  [facility] emergency department with profoundly ischemic right leg which  occurred at 11 o'clock a.m. today.  He had an acute onset of pain and  numbness in the right ankle and foot with preexisting symptoms of  claudication bilaterally in both legs. He was a normal sinus rhythm.  He had  weak femoral pulses bilaterally and a preoperative angiogram was emergently  performed by the radiologist which revealed a large embolus sitting in the  terminal aorta partially obstructing both iliac arteries. There was flow  down the left leg through the posterior tibial artery to the ankle. The  right leg  had a widely patent iliac, common femoral, superficial femoral and  popliteal artery with no flow down any of the tibial vessels below the knee.  It was assumed that he had embolized to his distal tibial arteries on the  right and was scheduled for exploration of both femoral arteries as well as  right popliteal artery.   PROCEDURE:  The patient was taken to the operating room, placed in supine  position at which time satisfactory general endotracheal anesthesia was  administered. The abdomen and both groins and the entire right leg were  prepped Betadine scrub and solution and draped in routine sterile manner.  Longitudinal incisions were made both inguinal regions carried down through  subcutaneous tissue.  The common superficial and profunda femoris arteries  dissected free bilaterally. There were weak pulses both common femoral  arteries. 6000 units of heparin given intravenously. Transverse openings  were made in both common femoral arteries. After occluding the inflow and  the distal vessels. Five Fogarty catheter was passed up the right side into  the aorta. Upon return, an abundant amount of embolic material  was retrieved  followed by excellent inflow.  Additional passes yielded no further debris.  Fogarty was then passed up the left common femoral artery and similar amount  of embolic debris retrieved followed by excellent inflow. Additional passes  yielded no further debris and the arteries then had the Fogarty catheters  passed distally and on the left side, this went to the ankle level and upon  return, no thrombus retrieved from the tibial vessels. On the right side the  Fogarty was passed down to the popliteal artery and there was no debris in  this segment either. Both common femoral arteries were repaired using  continuous 6-0 Prolene sutures. The clamps released. There was excellent  pulses proximally. Attention was turned to the right popliteal artery which  had been  exposed prior to the heparinization through a medial incision below  the knee. Distal popliteal artery had been dissected free, encircled with  vessel loop. Anterior tibial vein was ligated with 2-0 silk ties transecting  it and the tibioperoneal trunk exposed, the vessel loop around the posterior  tibial, the peroneal artery and the anterior tibial artery separately.  Transverse opening was made in the popliteal artery just proximal to the  origin of the anterior tibial artery. There was no thrombus in the popliteal  artery. Good inflow. Three Fogarty was passed in the anterior tibial artery  and went down to the ankle level and met resistance.  The first pass yielded  a long core of embolic debris filling all of the entire anterior tibial  artery. Additional passes yielded no further debris and heparin saline could  be flushed under high resistance. Fogarty was then passed down the peroneal  artery and posterior tibial artery with similar results with abundant amount  of embolic material being retrieved. After multiple passes, the artery was  reclosed with continuous 6-0 Prolene suture. Intraoperative arteriogram was  performed which revealed proximal filling of the anterior tibial, posterior  tibial and peroneal arteries with severe spasm in the vessels and no filling  in the lower third of the leg. Was then decided to infuse papaverine which  was done over 10-15 minute period with 60 milligrams in 30 mL. This was  slowly injected. There was brief improvement in Doppler flow in the lower  third of the leg and both posterior tibial and anterior tibial arteries but  this soon disappeared. Posterior tibial artery was then exposed distal to  its origin, encircled with vessel loops. Transverse opening made with 11  blade, a 2 Fogarty catheter passed down toward the ankle and met rigid  obstruction at the ankle level, would not pass beyond the medial malleolus. No clot was retrieved. Artery was  reclosed with two 7-0 Prolene sutures.  Anterior tibial artery was exposed just above the ankle joint through a  longitudinal incision and it was a very small vessel at this point. It was  opened transversely, would accept a 1.5 millimeter dilator.  2 Fogarty  catheter was passed proximally and would go to the popliteal artery without  obstruction.  Sluggish inflow was present through this diseased vessel. It  would not pass distally beyond the ankle joint. Artery was reclosed with two  7-0 Prolene sutures. Anterior compartment fasciotomy was performed through  one incision, the proximal third of the leg laterally excising the fascia in  the anterior and lateral compartments. When this was complete, the wounds  were all closed in layers with Vicryl and clips distally and Steri-Strips  proximally. Jackson-Pratt  drain was left draining the popliteal wound and  brought out through an inferiorly based stab wound secured with a nylon  suture. Sterile dressings were applied. The patient was taken to the  recovery in stable condition with a continued very ischemic right foot with  very poor prognosis for limb salvage.           ______________________________  Quita Skye Hart Rochester, M.D.     JDL/MEDQ  D:  12/24/2004  T:  12/25/2004  Job:  [id number]

## 2010-08-24 NOTE — Discharge Summary (Signed)
Marvin Nelson, Marvin Nelson                 ACCOUNT NO.:  [id number]   MEDICAL RECORD NO.:  [id number]          PATIENT TYPE:  INP   LOCATION:  2002                         FACILITY:  [facility]   PHYSICIAN:  Quita Skye. Hart Rochester, M.D.  DATE OF BIRTH:  1945-10-09   DATE OF ADMISSION:  01/18/2005  DATE OF DISCHARGE:                                 DISCHARGE SUMMARY   ANTICIPATED DATE OF DISCHARGE:  January 25, 2005.   HISTORY OF THE PRESENT ILLNESS:  The patient is a 65 year old male who was  recently discharged from the rehabilitation department following a hospital  stay for an ischemic right lower extremity that required eventual below knee  amputation.  He subsequently failed to heal this amputation and is admitted  this hospitalization for conversion to an above knee amputation.   PAST MEDICAL HISTORY:  The past medical history includes:  1.  Coronary artery disease.  2.  Hypertension.  3.  Dyslipidemia.  4.  Chronic low-back pain.  5.  Diabetes mellitus, Type 2.  6.  History of chronic tobacco abuse; he quit several weeks ago.   PAST SURGICAL HISTORY:  1.  Left knee arthroscopy.  2.  Right below the knee amputation.   ALLERGIES:  No known drug allergies.   MEDICATIONS:  Medications prior to admission:  1.  Coumadin 6 mg daily.  2.  Neurontin 300 mg twice daily and 500 mg at bedtime.  3.  Prevacid 30 mg daily.  4.  Elavil 50 mg at bedtime.  5.  Aspirin 81 mg daily.  6.  WelChol 625 mg 3 tablets twice a day.  7.  Toprol XL 25 mg daily.  8.  Glucophage 500 mg 2 tablets in the A.M. and 1 tablet in the P.M.  9.  Norvasc 5 mg daily.  10. Diovan 160 mg daily.  11. OxyContin XR 10 mg daily.  12. OxyIR p.r.n. for breakthrough pain.  13. Keflex 500 three times a day.   FAMILY HISTORY:  Please see the history and physical done at the time of  admission.   SOCIAL HISTORY:  Please see the history and physical done at the time of  admission.   REVIEW OF SYSTEMS:  Please see the history and  physical done at the time of  admission.   PHYSICAL EXAMINATION:  Please see the history and physical done at the time  of admission.   HOSPITAL COURSE:  The patient was admitted.  His wound was inspected daily.  It was hoped that he still had some chance of healing; however, he continued  to do poorly and the final decision was made to proceed with the above knee  amputation.  The patient underwent this procedure on January 21, 2005 by Dr.  Hart Rochester.  He tolerated this procedure well and was taken to the post-  anesthesia care unit in stable condition.   Postoperative Hospital Course:  The patient did well.  The incision is  healing well without evidence of infection and no signs of difficulty with  healing.  The patient's medical status remained stable. He had been  started  back on his Coumadin and was kept on the heparin drip until he was  therapeutic.  The patient was by rehabilitation and he was not felt to  require a formal rehabilitation at time.  Additionally, the patient does not  request home physical therapy as he feels as though he is able to manage on  his own and is refusing that assistance at this time.  The patient's over  status is felt to be stable for discharge in the morning January 25, 2005  pending morning rounds reevaluation.   DISCHARGE MEDICATIONS:  Medications on discharge are as preoperatively with  the except of not being on Keflex.   FOLLOW UP:  The patient is to have his INR checked in Dr. Ascencion Dike office on  Monday, January 28, 2005, and the Coumadin dose will be determined at that  time.  The patient will also see Dr. Hart Rochester in three weeks; the office will  call with this appointment.  He will have his staples removed at that time.   DISCHARGE INSTRUCTIONS:  Additional instructions the patient will receive  are instructions regarding medications, activities, diet, wound care, and  follow up.   DISCHARGE DIAGNOSES:  1.  Infected right below the knee  amputation with poor healing now status      post conversion to an above the knee amputation.  2.  Other diagnoses are as per the history.      Rowe Clack, P.A.-C.    ______________________________  Quita Skye Hart Rochester, M.D.    Sherryll Burger  D:  01/24/2005  T:  01/25/2005  Job:  [id number]   cc:   Bonnita Levan, M.D.  [place], [place]  Fax: [phone]

## 2010-08-24 NOTE — H&P (Signed)
Marvin Nelson, Marvin Nelson                 ACCOUNT NO.:  [id number]   MEDICAL RECORD NO.:  [id number]          PATIENT TYPE:  INP   LOCATION:  5705                         FACILITY:  [facility]   PHYSICIAN:  Quita Skye. Hart Rochester, M.D.  DATE OF BIRTH:  June 06, 1945   DATE OF ADMISSION:  01/18/2005  DATE OF DISCHARGE:                                HISTORY & PHYSICAL   CHIEF COMPLAINT:  Poorly-healing right below-the-knee amputation.   HISTORY OF PRESENT ILLNESS:  The patient is a 65 year old Caucasian male,  recently discharged from the rehabilitation department following a long  hospital stay.  He was originally admitted on December 24, 2004, for a  severely-ischemic right lower extremity, requiring emergent attempted limb  salvage via bilateral ileal-femoral embolectomies.  Additionally he had  exploration of the right popliteal artery with trans-popliteal embolectomy  of the anterior tibia and posterior tibial and peroneal arteries.  In  addition, he had a direct embolectomy of the posterior tibial artery by a  posterior tibial approach.  He also had exploration of the distal right  anterior tibial artery at the ankle with attempted thrombectomy.  Unfortunately, although he had pulses to the level of the ankle, he  continued to have a severely ischemic foot, due to retained clot and  subsequently came to a right below-the-knee amputation.  He did well from  this regard, and spent some time in the rehabilitation department, and was  eventually discharged from the hospital on January 10, 2005.   He was seen at the [facility] Office on January 15, 2005, and there was evidence  of good healing in the anterior and posterior flaps; however, the tissue  beneath it was felt to be very suspect, with some seropurulent drainage in a  few areas, with the tissue not appearing extremely healthy to Dr. Quita Skye.  Lawson's exam.  He was started at that time on Keflex 500 mg t.i.d.  He was  seen on today's date in the  hospital by Dr. Janetta Hora. Fields, where again  there was evidence of poor healing.  It has been determined that he should  be admitted to the hospital for increasing the amputation level to above-the-  knee due to this poor healing.  The patient has been on Coumadin for the  thrombosis, and he will be admitted for a couple of days to discontinue his  Coumadin prior to the surgery.   PAST MEDICAL HISTORY:  1.  Coronary artery disease.  2.  Hypertension.  3.  Dyslipidemia.  4.  Chronic low back pain.  5.  Diabetes mellitus type 2.  6.  History of chronic tobacco abuse.  He did quit approximately three weeks      ago.   PAST SURGICAL HISTORY:  1.  Left knee arthroscopy.  2.  Right below-the-knee amputation.   ALLERGIES:  No known drug allergies.   CURRENT MEDICATIONS:  1.  Coumadin 6 mg daily.  2.  Neurontin 300 mg b.i.d. and 600 mg at bedtime.  3.  Prevacid 30 mg daily.  4.  Elavil 50 mg q.h.s.  5.  Aspirin 81 mg daily.  6.  Welchol 625 mg, three tab b.i.d.  7.  Toprol XL 25 mg daily.  8.  Glucophage 500 mg, two tab q.a.m., one tab q.p.m.  9.  Norvasc 5 mg daily.  10. Diovan 160 mg daily.  11. OxyContin XR 10 mg daily.  12. OxyContin IR p.r.n. for break-through pain.  13. Keflex 500 mg t.i.d.   SOCIAL HISTORY:  He quit tobacco recently.  He does have a one-pack-per-day  use for approximately 50 years.  Alcohol use:  Heavy in the past.  None in  the past 13 years.   FAMILY HISTORY:  Father deceased at age 45 from a myocardial infarction.   REVIEW OF SYSTEMS:  He does have increasing pain in the right below-the-knee  amputation.  Otherwise he feels quite well.  He does have some admission of  shortness of breath with exertion in the recent past.  Otherwise he denies a  full system review.   PHYSICAL EXAMINATION:  VITAL SIGNS:  Blood pressure 104/68, heart rate 78,  respirations 12.  GENERAL:  This is a 65 year old white male, in no acute distress, alert and  oriented  x3.  HEENT:  Normocephalic and atraumatic.  Pupils equal, round, reactive to  light.  Oral mucosa pink and moist.  There are no pharyngeal exudates.  He  has poor dentition with multiple missing teeth.  NECK:  Supple, with no carotid bruits.  No thyromegaly.  No lymphadenopathy.  LUNGS:  Unlabored breathing.  Breath sounds are clear without wheeze,  rhonchi or rub.  HEART:  A regular rate and rhythm.  No murmurs, gallops or rubs.  ABDOMEN:  Soft, nontender.  No masses, no organomegaly.  GENITOURINARY/RECTAL:  Deferred.  VASCULAR:  The below-the-knee is currently wrapped, but reportedly there is  some separation, with poor evidence of healing.  This was examined in the  office by Dr. Darrick Penna.  PULSES:  He has palpable femorals bilaterally.  He does have a palpable  posterior tibial on the left.  NEUROLOGIC:  He is grossly intact.  Range of motion is grossly full.  Strength is grossly equal bilaterally and 5+.   IMPRESSION:  Poorly-healing below-the-knee amputation.   PLAN:  For conversion to an above-the-knee amputation.      Rowe Clack, P.A.-C.    ______________________________  Quita Skye Hart Rochester, M.D.    Sherryll Burger  D:  01/18/2005  T:  01/18/2005  Job:  [id number]   cc:   Quita Skye. Hart Rochester, M.D.  [address]  [place]  [place] [address]   Bonnita Levan  Fax: [phone]   Clyde Lundborg, M.D.  [address] [place], [place] [address]

## 2010-08-24 NOTE — Discharge Summary (Signed)
Marvin Nelson, Marvin Nelson                 ACCOUNT NO.:  [id number]   MEDICAL RECORD NO.:  [id number]          PATIENT TYPE:  INP   LOCATION:  2030                         FACILITY:  [facility]   PHYSICIAN:  Quita Skye. Hart Rochester, M.D.  DATE OF BIRTH:  1945-11-06   DATE OF ADMISSION:  12/24/2004  DATE OF DISCHARGE:  01/04/2005                                 DISCHARGE SUMMARY   HISTORY OF PRESENT ILLNESS:  The patient is a 65 year old male who presented  acutely to [facility] on December 24, 2004 with pain in his right  foot.  The pain was acute in onset while doing some yard work.  The foot  became quite painful.  Additionally, there were symptoms of numbness,  increasing coldness, and some tingling sensations.  He maintained gross  motor and sensation to the foot and presented to [facility], where  initial impression was profound ischemia to the right lower extremity.  Telephonic vascular surgical opinion was obtained with Dr. Hart Rochester, who  recommended transferring for further evaluation and treatment.   PAST MEDICAL HISTORY:  1.  Hypertension.  2.  Diabetes mellitus type 2.  3.  Chronic low back pain.  4.  Coronary artery disease, status post previous percutaneous intervention,      although not entirely sure of exact procedure.  5.  Tobacco abuse.   PAST SURGICAL HISTORY:  Left knee arthroscopy.   ALLERGIES:  No known drug allergies.   MEDICATIONS PRIOR TO ADMISSION:  1.  Diclofenac 75 mg twice daily.  2.  WelChol 625 mg 3 tablets twice daily.  3.  Glucophage 2,000 mg daily.  4.  Neurontin 300 mg in the a.m., 300 mg at noon, and 600 mg at bedtime.  5.  Prevacid 30 mg daily.  6.  Amitriptyline 50 mg at bedtime.  7.  Diovan 160/25, 1 daily.  8.  Aspirin 81 mg daily.   FAMILY HISTORY/SOCIAL HISTORY/REVIEW OF SYMPTOMS/PHYSICAL EXAMINATION:  Please see the history and physical done at the time of admission.   HOSPITAL COURSE:  The patient was admitted through the emergency  room after  evaluation by Dr. Hart Rochester.  He was felt to require emergent operative  exploration.  He was taken to the operating room, where the following  procedures were undertaken:  1.  Bilateral iliofemoral embolectomies.  2.  Exploration of right popliteal artery, with transpopliteal embolectomy      of the anterior tibial, posterior tibial, and peroneal arteries.  3.  Intraoperative arteriogram.  4.  Direct embolectomy of the posterior tibial artery via a posterior tibial      approach.  5.  Exploration of distal right anterior tibial artery at the ankle, with      attempted thrombectomy.  6.  Papaverine infusion of distal tibial vessels.  The patient tolerated the      procedure well and was taken to the postanesthesia care unit.   POSTOPERATIVE HOSPITAL COURSE:  The patient's postoperative course was  complicated by the fact that __________ thrombus in his foot.  He was  watched closely.  However, it came to the  conclusion that due to ongoing  pain which was severe and the fact that all thrombus could not be removed  surgically, his best option would be proceeding with below-knee amputation.  This was done on December 26, 2004 by Dr. Hart Rochester and tolerated well.   POSTOPERATIVE HOSPITAL COURSE FOLLOWING SECOND PROCEDURE:  The patient has  done well.  A cardiology consultation was obtained.  A transesophageal  echocardiogram was done, and this evidenced no source of emboli.  The  patient was started on Coumadin therapy.  He did have an episode from which  he fell, and the stump from the below-knee amputation did bleed, and  Coumadin was held for a couple days.  This has improved, and the incision is  healing well.  The Coumadin has been restarted.  Most recent INR dated  January 04, 2005 was 1.9, for which he received 2.5 mg of Coumadin.  Additionally, the patient did have an episode of postoperative hyponatremia  due to mild dehydration.  His sodium did go as low as 125, but  improved with  intravenous isotonic saline.  Currently, the IVs have been discontinued, and  his most recent sodium is 137, with BUN 14 and creatinine 0.9.  He has had  physical therapy on the floor, and recommendations to proceed with inpatient  cardiac rehabilitation were agreed upon, and he is stable from a medical  viewpoint to go to rehabilitation on today's date.   MEDICATIONS ON TRANSFER:  1.  Nicotine patch 21 mg q.24 h.  2.  Neurontin 300 mg b.i.d., and also 600 mg at bedtime.  3.  Protonix 40 mg daily.  4.  Amitriptyline 50 mg at bedtime.  5.  Coumadin, dosage to be determined based on daily INR.  He appears to      require approximately 2.5 mg daily.  6.  Colace 100 mg daily.  7.  Glucophage 500 mg b.i.d.  8.  Voltaren 75 mg b.i.d.  9.  Aspirin 81 mg daily.  10. Zocor 20 mg at bedtime.  11. Toprol XL 25 mg daily.  12. Norvasc 5 mg daily.  13. Avapro 150 mg daily.  14. For pain, Tylox 1 or 2 q.4-6 h. p.r.n. as needed for pain.   INSTRUCTIONS:  After rehabilitation, the patient should obtain appointment  to see Dr. Hart Rochester in the CVTS office.  Staples can be removed approximately  30 days from amputation date, December 26, 2004.  Office appointment can be  arranged at [phone].   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  No cardiac source of the thrombus was proven.   OTHER DIAGNOSES:  1.  Hypertension.  2.  Diabetes mellitus type 2.  3.  Chronic low back pain.  4.  Coronary artery disease.  5.  History of tobacco abuse, approximately 50 years.  6.  Previous surgical history of a left knee arthroscopy.   CONDITION ON DISCHARGE:  Stable and improving.   PLAN:  Plan is for transfer to rehabilitation unit for inpatient modalities  to improve mobility with new amputation.      Rowe Clack, P.A.-C.   ______________________________  Quita Skye Hart Rochester,  M.D.    Sherryll Burger  D:  01/04/2005  T:  01/04/2005  Job:  [id number]   cc:   Quita Skye. Hart Rochester, M.D.  [address]  [place]  [place] [address]   [facility]

## 2010-08-24 NOTE — Cardiovascular Report (Signed)
[place]  Patient:    Marvin Nelson, Marvin Nelson Visit Number: [id number] MRN: [id number]          Service Type: MED Location: [phone] Attending Physician:  Junious Silk Dictated by:   Daisey Must, M.D. [facility] Proc. Date: 05/29/01 Admit Date:  05/28/2001   CC:         [facility]  Holley Dexter, M.D., [facility]  Cardiac Catheterization Laboratory   Cardiac Catheterization  PROCEDURES PERFORMED: Left heart catheterization with coronary angiography and left ventriculography.  INDICATIONS: The patient is a 65 year old male, who presented to the office in [place] with progressive substernal chest pain. ECG showed T wave inversions in anterolateral leads. He was admitted to the hospital and ruled out for myocardial infarction. He was subsequently referred for cardiac catheterization.  DESCRIPTION OF PROCEDURE: A 6 French sheath was placed in the right femoral artery.  Standard Judkins 6 French catheters were utilized. There was significant catheter-induced spasm of the ostium and proximal right coronary artery. This was partially relieved with intracoronary nitroglycerin. Contrast was Omnipaque.  There were no complications.  RESULTS:  HEMODYNAMICS: Left ventricular pressure 108/12.  Aortic pressure 134/78. There was no aortic valve gradient.  LEFT VENTRICULOGRAM: Wall motion is normal. Ejection fraction calculated at 61%.  There is trace mitral regurgitation.  CORONARY ARTERIOGRAPHY: (Right dominant).  Left main is normal.  Left anterior descending artery has a 20% stenosis in the proximal vessel, 40% in the mid vessel and the distal LAD has a tubular 70% stenosis. The LAD beyond this is a slender vessel. The LAD gives rise to a normal sized first diagonal and a small second diagonal.  Left circumflex has minor luminal irregularities in the proximal and distal vessel. The circumflex is a very large vessel. It gives rise to  a small OM-1, a large OM-3, which has a 20% stenosis and a large OM-3.  Right coronary artery is a dominant vessel. In the proximal right coronary there is a 40-50% stenosis with some catheter-induced spasm which was improved but not completely relieved with intracoronary nitroglycerin. In the distal vessel is a 30% stenosis. The distal right coronary artery gives rise to a large posterior descending artery, a small posterolateral branch.  IMPRESSIONS: 1. Normal left ventricular systolic function. 2. One-vessel coronary artery disease characterized by disease in the    distal left anterior descending of moderate severity and was a relatively    small caliber vessel.  PLAN: An adenosine Cardiolite will be obtained to rule out ischemia. If there is no or mild ischemia on this scan, we will treat the patient medically. If there is significant ischemia in the distribution of the LAD, would consider percutaneous intervention. Dictated by:   Daisey Must, M.D. [facility] Attending Physician:  Junious Silk DD:  05/29/01 TD:  05/30/01 Job: [id number]

## 2010-08-24 NOTE — Discharge Summary (Signed)
Marvin Nelson. [facility]  Patient:    Marvin Nelson, Marvin Nelson Visit Number: [id number] MRN: [id number]          Service Type: MED Location: [phone] Attending Physician:  Junious Silk Dictated by:   Brita Romp, P.A.C. Admit Date:  05/28/2001 Discharge Date: 05/31/2001   CC:         Dr. _________, [place]ita [place], [place]  Jonelle Sidle, M.D. [facility], [facility] Card[facility]  [facility], [place], [place]   Discharge Summary  DISCHARGE DIAGNOSES: 1. Coronary artery disease. 2. Diabetes mellitus. 3. Hypertension. 4. Hyperlipidemia. 5. Osteoarthritis.  HOSPITAL COURSE:  Marvin Nelson is a 65 year old male who initially presented to Dr. _________ office on May 28, 2001, for routine physical.  While there, he experienced some chest discomfort and was admitted to [facility].  He was seen in consultation by Jonelle Sidle, M.D.  Given the patients history, Dr. Alvester Morin felt that cardiac catheterization was indicated and had the patient transferred to [place]. [facility]  On May 29, 2001, the patient was taken to the cardiac catheterization by Daisey Must, M.D.   Catheterization results: 1. Left main coronary artery:  Normal. 2. Left anterior descending coronary artery:  20% stenosis proximally, 40%    mid vessel, tubular 70% stenosis distally.  The LAD distal to that lesion    was a narrow vessel.  LAD gave rise to a normal first and small second    diagonals. 3. Left circumflex system minor luminal irregularities in the proximal and    distal portions.  Circumflex is a rather large vessel which gave rise to    a small OM1 and a large OM2.  He had 20% stenosis in the large OM3. 4. Right coronary artery:  Dominant; there was a 40 to 50% stenosis in the    proximal portion of the vessel with some catheter induced spasm.  Distally    there was 30% stenosis.  Distal right coronary gave rise to a large PDA and    a small posterolateral branch. 5.  Left ventriculogram:  No wall motion abnormalities; ejection fraction 61%.  Dr. Gerri Spore recommended adenosine Cardiolite to assess the significance in the lesion in the left anterior descending.  If it was negative, then the patient should get medical therapy. If it was positive, then he would recommend intervention on the lesion of the LAD.  On May 31, 2001, the patient underwent adenosine Cardiolite exam. Nuclear imaging revealed no ischemia or scar and normal ejection fraction.  As a result, the patient was felt to be stable for discharge.  DISCHARGE MEDICATIONS: 1. Enteric coated aspirin 325 mg q.d. 2. Dyazide 37.5/12.5 q.d. 3. Lipitor 10 mg q.h.s. 4. Celebrex  200 mg q.d. 5. Allegra 180 mg q.d. 6. Prevacid 30 mg q.d. 7. Glucophage XR 500 mg q.d., to be resumed the morning of June 01, 2001. 8. Atenolol 100 mg q.d. 9. Sublingual nitroglycerin as needed.  LAB VALUES:  White count 9.8, hemoglobin 13.0, hematocrit 37.7, platelets 210. Total cholesterol 151, triglycerides 401, HDL 32, LDL not calculated.  A 2-view chest x-ray on May 29, 2001, revealed some mild bronchitic changes with no acute disease.  Electrocardiogram shows normal sinus rhythm at 64.  PR interval 186, QRS 80, QTC 404, axis -15.  There was some nonspecific T-wave changes laterally.  DISCHARGE INSTRUCTIONS: 1. The patient is to slowly return to his normal level of activity. 2. He is to follow a low fat diabetic diet. 3. He  is to watch the cath site for pain, bleeding, or swelling and to call    the [place] office if any of these problems. 4. He is to make a follow-up appointment with Dr. __________ within two    weeks. 5. He is to contact Jonelle Sidle, M.D.s, office on the Monday after    discharge for two-week appointment. Dictated by:   Brita Romp, P.A.C. Attending Physician:  Junious Silk DD:  05/31/01 TD:  06/01/01 Job: [id number]

## 2010-08-24 NOTE — Assessment & Plan Note (Signed)
HISTORY OF PRESENT ILLNESS:  Marvin Nelson is back regarding his right above knee  amputation.  He initially had a right below-the-knee amputation when we  first met in September.  He has had problems with the wound dehiscing and  ultimately the surgery was revived to above-the-knee amputation by Dr.  Darrick Penna.  The patient has had problems with slow wound healing since that  time.  He is here for evaluation for prosthetic fitting.  The patient is  having minimal pain ranging from a 0-4/10 on the right leg.  More of his  pain is in the left side which he describes as a sciatica.  Pain is aching,  tingling, seems to bother him the most when he is driving or sitting in an  upright chair.  He does best lying on the floor when he is in his lounge  chair.  Activity is minimal at home due to pain and lack of a right leg.  He  can walk about 5 minutes at a time with his standard walker.   MEDICATIONS:  1.  Metformin.  2.  Welchol.  3.  Neurontin 300 mg b.i.d. and 600 mg q.h.s.  4.  Elavil 50 mg q.h.s.  5.  Diovan/HCTZ.  6.  Aspirin.  7.  Norvasc.  8.  Glucotrol.  9.  Warfarin.  10. Toprol.  11. Prevacid.   REVIEW OF SYSTEMS:  The patient denies any neurological, psychiatric, GU, GI  or cardiorespiratory complaints today.   SOCIAL HISTORY:  The patient is married.  Wife is very supportive.  She has  been assisting with chores at home and with wound care.   PHYSICAL EXAMINATION:  VITAL SIGNS:  Blood pressure 118/67, pulse 101,  respirations 16, saturating 98% on room air.  GENERAL APPEARANCE:  The patient is pleasant, no acute distress.  He is  alert and oriented x3.  NEUROLOGICAL:  Carotid leg strength is good.  Left flank strength and range  of motion were within normal limits.  He did have a positive seated slump  test on the left side.  Pulses were 2+.  Right leg strength was 4-4+/5 with  hip extension and flexion.  Right residual limb has a large imagination  where the wound had scarred  over.  There were some deep recesses.  There  appeared to be a mild serosanguineous discharge.  I did palpate the area  with Q-Tip and the area did not track __________ but there was an open area  of granulation which was leading with contact of the swab.  Cognitively, the  patient is appropriate.  Cranial nerve exam was intact.  HEART:  Regular rate and rhythm.  LUNGS:  Clear.  ABDOMEN:  Soft, nontender.   ASSESSMENT:  1.  Right above-the-knee amputation which has been slow to heal.  2.  Diabetes.  3.  Left leg pain consistent with the lumbar radiculopathy.   PLAN:  1.  Supplied the patient back exercises and recommended extension activities      and exercises off the floor.  2.  Will try wet-to-dry packing for one week and then switch to a Vaseline      gauze packing for week #2.  After the two weeks are up, I would move to      a dry dressing.  Wife will need to keep close eye on the area for      drainage and overall cleanliness.  This area will likely require long-      term maintenance  due to the location and anatomy of the residual limb.  3.  I will see the patient back in about four weeks time follow up to assess      his appropriateness for prosthetic fitting.  I saw the patient with Carolyne Fiscal today of [facility].      Ranelle Oyster, M.D.  Electronically Signed     [id number]  D:  04/09/2005 12:58:38  T:  04/09/2005 13:56:59  Job #:  [id number]   cc:   Janetta Hora. Fields, MD  [address], [place] [address]

## 2010-08-24 NOTE — H&P (Signed)
NAME:  NEELY, CECENA                           ACCOUNT NO.:  [id number]   MEDICAL RECORD NO.:  [id number]                   PATIENT TYPE:  INP   LOCATION:  A210                                 FACILITY:  [facility]   PHYSICIAN:  Hanley Hays. Dechurch, M.D.           DATE OF BIRTH:  01-12-1946   DATE OF ADMISSION:  03/26/2002  DATE OF DISCHARGE:                                HISTORY & PHYSICAL   HISTORY OF PRESENT ILLNESS:  The patient is a 65 year old Caucasian male  with known coronary artery disease followed by the [facility]  group who was in the endoscopy lab this morning preparing for upper and  lower endoscopy to evaluate abdominal bloating and pain when he noted to his  wife that he was having left arm pain.  This apparently is his anginal  equivalent.  He notes the pain had started last night and resolved after  nitroglycerin, but persisted this morning.  He notes this pain usually  occurs when he gets up in the morning and usually resolves with  nitroglycerin.  Although his pain episodes have decreased in frequency,  apparently they are requiring more nitroglycerin in order to relieve them.  He had an episode a week ago while watching television where he noted  pressure in his chest which lasted only a few seconds and then resolved and  has had no recurrences, but was associated with shortness of breath.  The  patient is fairly sedentary secondary to his back problems.  He underwent  catheterization in February where findings noted normal left main but  disease in the LAD described as a tubular 70% distal stenosis with a small  vessel distal to that.  He had lumina irregularities in the circumflex  system and a 40-50% stenosis in the right coronary artery with a 30% distal  stenosis.  He had no wall motion abnormalities at that time and his EF was  estimated at 61%.  He underwent an adenosine Cardiolite following his  catheterization to assess for ischemia and was  negative, and was continued  on medical therapy.  He has been compliant with his medical recommendations.  He states his cholesterol and diabetes have been under good control.  His  exercise has always been limited but actually is improved, as he quit  smoking about one year prior.  The patient does not tend to have exertional  symptoms.  His primary medical doctor is Dr. Johny Shears in [place].   PAST MEDICAL HISTORY:  In addition to coronary artery disease, includes:  1. Diabetes mellitus, well controlled.  2. Tobacco abuse, quit in January 2003.  3. History of alcohol abuse, quit in 1992.  4. COPD on MDIs p.r.n. now.  5. Hyperlipidemia.  6. Hypertension.   SOCIAL HISTORY:  He is disabled secondary to back problems.  He is married  x4 years.  He has one son who is age 74 alive  and well.  A 40 pack-year  tobacco.  Heavy drinker, none since 1992.   FAMILY MEDICAL HISTORY:  His father died at age 65 with MI.  Two paternal  uncles with heart disease.  One sister alive and well.   ALLERGIES:  No known allergies.   REVIEW OF SYSTEMS:  Muscle pain with Lipitor.  No hypoglycemia.  GI bloating  and abdominal pain, constipation - workup was in progress.  Occasional  reflux.    MEDICATIONS:  1. Glucophage 1500 mg each supper.  2. Enteric-coated aspirin 81 daily.  3. Diazide 37.12.5 daily.  4. Celebrex 200 daily.  5. Allegra 180 daily.  6. Prevacid 30 daily.  7. Atenolol 100 daily.  8. Sublingual nitroglycerin p.r.n.  9. Welchol 3750 q.d.   PHYSICAL EXAMINATION:  GENERAL:  Reveals a well-developed, well-nourished,  alert, appropriate male in no distress.  VITAL SIGNS:  Blood pressure 150/70, pulse 45 and regular sinus.  No  respiratory distress.  NECK:  No JVD, adenopathy, or thyromegaly; no bruits.  HEENT:  Oropharynx is moist.  The patient has dentures.  LUNGS:  Clear to auscultation anterior and posterior though diminished.  HEART:  Regular. No murmur, gallop, or rub.  ABDOMEN:   Protuberant, soft, active bowel sounds.  EXTREMITIES:  Without clubbing, cyanosis, or edema with good distal pulses.  NEUROLOGIC:  Completely intact.   ASSESSMENT AND PLAN:  51. A 65 year old with known coronary artery disease.  Initial enzymes are     negative.  He had nonobstructing disease in February 2003 which raises     the question of spasm.  His systolic pressure is not exceptionally well     controlled.  Will add Norvasc, Lovenox at this point, and continue     aspirin.  If his pain continues then a nitroglycerin drip and transfer to     [facility] for further evaluation.  Cardiology consultation has been     placed.  2. Increased liver function tests, probably fatty infiltration per primary     physician in this patient with diabetes mellitus.  Doubt related to     medications.  Need comparison and will review old records.  3. Diabetes mellitus.  The patient notes well controlled without     hypoglycemia on the current regimen.  4.     Disability secondary to back problems.  Continue his Celebrex.  This raises      the question of whether this is playing a role in his GI symptoms.   In any event, the plan was discussed with the patient and his wife who seem  to have reasonable understanding.                                               Hanley Hays Josefine Class, M.D.    FED/MEDQ  D:  03/26/2002  T:  03/27/2002  Job:  [id number]   cc:   Zigmund Gottron  [address]  [place]  [place] [address]  Fax: [phone]   Jonelle Sidle, M.D. [facility]  [address]  [place]  [place] [address]  Fax: [phone]

## 2010-08-24 NOTE — H&P (Signed)
NAMEALESANDRO, Marvin Nelson                 ACCOUNT NO.:  [id number]   MEDICAL RECORD NO.:  [id number]          PATIENT TYPE:  IPS   LOCATION:  4037                         FACILITY:  [facility]   PHYSICIAN:  Ranelle Oyster, M.D.DATE OF BIRTH:  [birth year]   DATE OF ADMISSION:  01/04/2005  DATE OF DISCHARGE:                                HISTORY & PHYSICAL   CHIEF COMPLAINT:  Right leg pain, status post right below the knee  amputation.   HISTORY OF PRESENT ILLNESS:  This 65 year old white male with a history of  type 2 diabetes, who was admitted with right foot pain and ischemic changes.  The patient was found to have a large embolus into the terminal aorta  obstructing both iliac arteries with no flow down either of the tibial  vessels below the knee.  The patient underwent bilateral iliofemoral  embolectomies and direct embolectomy, on December 24, 2004, by Dr. Hart Rochester.  Also  the patient required a right below the knee amputation the same day.  The patient was placed on Coumadin, after his below knee amputation.  The  hospital course was complicated by hyponatremia.  The patient had a fall, on  January 01, 2005, with bleeding at the amputation site.  No surgical  intervention was taken.  The patient also was noted to have some dysphagia  with +/- nausea and vomiting.  An EGD was negative for strictures, only  revealed a mild gastritis.  The patient has been moving with therapy and  still needing assistance with basic mobility and self care.   REVIEW OF SYSTEMS:  The patient reports improved dysphagia but occasional  reflux.  He has tingling and numbness in the right leg and some mild phantom  sensations.  He denies any nausea, vomiting currently.  He has been  breathing well.  Other review of systems items are in the written H&P.   PAST MEDICAL HISTORY:  1.  Hypertension.  2.  Type 2 diabetes.  3.  Chronic low back pain.  4.  Coronary artery disease.  5.  Left knee arthroplasty.  6.  Increased lipids.  7.  History of tobacco.  8.  History of alcohol use.   FAMILY HISTORY:  Noncontributory.   SOCIAL HISTORY:  The patient lives with his wife in [place].  Wife works during  the day.  The patient is on disability for an old back injury.  The patient  has a one level home with two steps to enter.  He was independent prior to  arrival at the household level.   ALLERGIES:  None.   MEDICATIONS:  Diclofenac, Welchol, Glucophage, Neurontin, Prevacid, Elavil,  Diovan, and aspirin.   PHYSICAL EXAMINATION:  VITAL SIGNS:  Blood pressure 106/67, pulse 89,  respiratory rate is 18, temperature is 96.9.  GENERAL:  The patient is pleasant, no acute distress.  He is alert and  oriented x3.  Affect is appropriate.  EARS/NOSE/THROAT:  Unremarkable.  Oral mucosa is pink and moist.  NECK:  Supple without JVD or lymphadenopathy.  CHEST:  Clear to auscultation bilaterally without wheezes, rales, or  rhonchi.  HEART:  Regular rate and rhythm without murmurs, rubs, or gallops.  ABDOMEN:  Soft, nontender.  MUSCULOSKELETAL:  Gait is not tested.  The patient has full range of motion  of the right knee.  His right knee wound is minimally swollen.  The area is  slightly erythematous.  He has moderate serosanguineous discharge from the  wound line.  Remainder of his skin was generally intact today.  NEUROLOGIC:  Cranial nerves II-XII are intact.  Reflexes are decreased.  Sensation is decreased in the peripheral extremities.  Judgment,  orientation, and memory are all stable and normal.  On motor examination,  muscle function is 4 to 4+ out of 5 in the proximal upper extremities, 4+ to  5 out of 5 distally.  Bilateral lower extremities are 4/5 proximally.  The  left lower extremity is 4+ to 5 out of 5.   ASSESSMENT/PLAN:  1.  Functional deficits secondary to right below the knee amputation.      Beginning comprehensive inpatient rehabilitation with modified      independent goals at the  wheelchair level.  Estimated length of stay is      seven days or less.  Prognosis is good.  2.  Pain management with oxycodone IR p.r.n. and Neurontin t.i.d.  The      patient is having minimal phantom pain at this point but does have      phantom sensations.  3.  Deep vein thrombosis prophylaxis with Coumadin.  4.  Hypertension.  Continue Toprol, Norvasc, Avapro.  5.  Type 2 diabetes.  Check CBGs a.c. and h.s.  Cover with sliding scale      insulin.  Continue Glucophage 500 mg b.i.d.  6.  Hyperlipidemia.  7.  Coronary artery disease status post percutaneous transluminal coronary      angioplasty.  8.  History of chronic low back pain.  9.  Hyponatremia.  Observe.  10. Wound.  Continue dry dressings but perform b.i.d.  The patient is not      quite ready for a shrinker at this point.      Ranelle Oyster, M.D.  Electronically Signed     ZTS/MEDQ  D:  01/04/2005  T:  01/04/2005  Job:  [id number]
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
## 2010-08-24 NOTE — Op Note (Signed)
NAMEJESSEY, STEHLIN                 ACCOUNT NO.:  [id number]   MEDICAL RECORD NO.:  [id number]          PATIENT TYPE:  INP   LOCATION:  5148                         FACILITY:  [facility]   PHYSICIAN:  Quita Skye. Hart Rochester, M.D.  DATE OF BIRTH:  1946-01-27   DATE OF PROCEDURE:  01/31/2005  DATE OF DISCHARGE:                                 OPERATIVE REPORT   PREOPERATIVE DIAGNOSIS:  Infected above the knee amputation stump, right  leg.   POSTOPERATIVE DIAGNOSIS:  Infected above the knee amputation stump, right  leg.   OPERATION:  Incision and drainage of right above knee amputation stump.   SURGEON:  Quita Skye. Hart Rochester, M.D.   FIRST ASSISTANT:  Nurse.   ANESTHESIA:  LMA.   DESCRIPTION OF PROCEDURE:  The patient was taken to the operating room and  placed in the supine position at which time satisfactory LMA anesthesia was  administered.  The right amputation stump was prepped with Betadine solution  and draped in a routine sterile manner.  Skin staples were removed and there  was mucopurulent drainage in the subcutaneous tissue throughout which was  cultured both aerobically and anaerobically.  Subcutaneous tissues were  removed as were the deep fascial sutures and some of the infection extended  deep to the muscles in this area.  This was thoroughly irrigated with saline  and the  muscle appeared healthy although the infection was present.  Following thorough irrigation, the wound was packed open with a Kerlix  moistened with sterile saline and it was wrapped with a Kerlix, 4 by 4s, and  an Ace.  The patient was taken to the recovery room in satisfactory  condition.           ______________________________  Quita Skye Hart Rochester, M.D.     JDL/MEDQ  D:  01/31/2005  T:  01/31/2005  Job:  [id number]

## 2010-08-24 NOTE — Op Note (Signed)
NAMEORLAN, AVERSA                 ACCOUNT NO.:  [id number]   MEDICAL RECORD NO.:  [id number]          PATIENT TYPE:  INP   LOCATION:  5704                         FACILITY:  [facility]   PHYSICIAN:  Quita Skye. Hart Rochester, M.D.  DATE OF BIRTH:  1945-07-22   DATE OF PROCEDURE:  01/21/2005  DATE OF DISCHARGE:                                 OPERATIVE REPORT   PREOP DIAGNOSIS:  Nonhealing right below-knee amputation stump.   POSTOPERATIVE DIAGNOSIS:  Nonhealing right below-knee amputation stump.   OPERATIONS:  Right above-knee amputation.   SURGEON:  Dr. Hart Rochester.   FIRST ASSISTANT:  Pecola Leisure, PA.   ANESTHESIA:  General endotracheal.   PROCEDURE:  Patient was taken to the operating room, placed in the supine  position at which time satisfactory general endotracheal anesthesia was  administered. The right leg was prepped with Betadine scrub solution and  draped in routine sterile manner, isolating the below-knee amputation stump  with impervious stockinette. Skin flaps for above-knee amputation were  marked with equal anterior posterior flaps basing the femur as low as  possible about 4-5 inches above the knee joint. After incision was made with  scalpel and carried down through subcutaneous tissue, the fascia and muscle  were divided with the Bovie. The femur was cleaned proximally with  periosteal elevator, divided with a Stryker saw and smoothed with a rasp.  Superficial femoral artery nerve and vein were individually ligated with 2-0  silk ties and divided.  Posterior muscles divided with the Bovie. The  specimen removed from the table. After thorough irrigation, medium Hemovacs  were brought out medially and laterally secured with silk sutures and the  fascia was closed over the bone with interrupted 0 Vicryl. Skin with 2-0  Vicryl in a subcuticular fashion and skin clips. Sterile dressing applied.  The patient taken to recovery in satisfactory condition.     ______________________________  Quita Skye Hart Rochester, M.D.     JDL/MEDQ  D:  01/21/2005  T:  01/21/2005  Job:  [id number]

## 2010-08-24 NOTE — Cardiovascular Report (Signed)
NAME:  Marvin Nelson, Marvin Nelson                           ACCOUNT NO.:  [id number]   MEDICAL RECORD NO.:  [id number]                   PATIENT TYPE:  OIB   LOCATION:  6501                                 FACILITY:  [facility]   PHYSICIAN:  Salvadore Farber, M.D. [facility]         DATE OF BIRTH:  1946-03-09   DATE OF PROCEDURE:  07/14/2003  DATE OF DISCHARGE:                              CARDIAC CATHETERIZATION   PROCEDURE:  Left heart catheterization, left ventriculography, coronary  angiography.   INDICATION:  [name] is a 65 year old gentleman with cardiac risk factors  of diabetes mellitus, and dyslipidemia.  He previously underwent cardiac  catheterization February 2003.  The distal LAD was noted to have a 70%  stenosis and preserved left ventricular systolic function.  He was managed  medically.  He has recently represented with complaints of mild chest  discomfort and more pronounced fatigue and dyspnea.  A Cardiolite study  performed June 22, 2003 demonstrated possible subtle anterior reversible  defect, but no definitive evidence of ischemia.  Based on ongoing symptoms,  previously documented coronary disease and equivocal Cardiolite he is  referred for diagnostic angiography.   PROCEDURAL TECHNIQUE:  Informed consent was obtained.  Under 1% lidocaine  local anesthesia, a 5 French sheath was placed in the right femoral artery  using the modified Seldinger technique.  Diagnostic angiography and  ventriculography were performed using JL-4, JR-4, and pigtail catheters.  The patient tolerated the procedure well and was transferred to the holding  room in stable condition.  Sheaths will be removed there.   COMPLICATIONS:  None.   FINDINGS:  1. LV:  132/4/12. EF 70% without regional wall motion abnormality.  2. No aortic stenosis or mitral regurgitation.  3. Left main:  Angiographically normal.  4. LAD:  The LAD is a moderate size vessel giving rise to a large first and     a small second  diagonal branch.  There was a 70% stenosis of the distal     vessel beyond the takeoff of the second diagonal.  However, after the     administration of intracoronary nitroglycerin this stenosis reduced to     well under 20%.  There is a 30% stenosis between the first and second     diagonal which appears to be fixed obstruction.  5. Circumflex:  Large vessel giving rise to three obtuse marginals.  The     first is dimunitive.  Second and third are large.  There are only minor     luminal irregularities in this territory.  6. RCA:  Large, dominant vessel.  There is a 30% ostial stenosis and a 20%     stenosis just before the takeoff of the PDA.   IMPRESSION/RECOMMENDATIONS:  Minimal fixed coronary disease.  There was  asymptomatic spasm of the mid LAD at the site which was previously felt to  represent a fixed stenosis.  In the differential diagnosis for  his ongoing  pain is spontaneous coronary spasm versus noncardiac chest discomfort.  Would consider a trial of discontinuation of beta blockade and continued  calcium channel blockers and perhaps initiation of nitroglycerin.                                               Salvadore Farber, M.D. [facility]    WED/MEDQ  D:  07/14/2003  T:  07/14/2003  Job:  [id number]   cc:   Wyvonnia Lora  [address]  [place]  [place] [address]  Fax: [phone]   Jonelle Sidle, M.D. [facility]

## 2012-08-07 ENCOUNTER — Inpatient Hospital Stay (HOSPITAL_COMMUNITY)
Admission: AD | Admit: 2012-08-07 | Discharge: 2012-08-10 | DRG: 287 | Disposition: A | Discharge status: Home / Self Care | Payer: Medicare Other | Source: Other Acute Inpatient Hospital | Attending: Cardiology | Admitting: Cardiology

## 2012-08-07 ENCOUNTER — Encounter (HOSPITAL_COMMUNITY): Payer: Self-pay | Admitting: Cardiology

## 2012-08-07 ENCOUNTER — Other Ambulatory Visit: Payer: Self-pay | Admitting: Physician Assistant

## 2012-08-07 DIAGNOSIS — K5732 Diverticulitis of large intestine without perforation or abscess without bleeding: Secondary | ICD-10-CM | POA: Diagnosis present

## 2012-08-07 DIAGNOSIS — I200 Unstable angina: Secondary | ICD-10-CM

## 2012-08-07 DIAGNOSIS — I2510 Atherosclerotic heart disease of native coronary artery without angina pectoris: Principal | ICD-10-CM | POA: Diagnosis present

## 2012-08-07 DIAGNOSIS — I7389 Other specified peripheral vascular diseases: Secondary | ICD-10-CM | POA: Diagnosis present

## 2012-08-07 DIAGNOSIS — K219 Gastro-esophageal reflux disease without esophagitis: Secondary | ICD-10-CM | POA: Diagnosis present

## 2012-08-07 DIAGNOSIS — Z794 Long term (current) use of insulin: Secondary | ICD-10-CM

## 2012-08-07 DIAGNOSIS — E1149 Type 2 diabetes mellitus with other diabetic neurological complication: Secondary | ICD-10-CM | POA: Diagnosis present

## 2012-08-07 DIAGNOSIS — D649 Anemia, unspecified: Secondary | ICD-10-CM | POA: Diagnosis present

## 2012-08-07 DIAGNOSIS — M129 Arthropathy, unspecified: Secondary | ICD-10-CM | POA: Diagnosis present

## 2012-08-07 DIAGNOSIS — E1142 Type 2 diabetes mellitus with diabetic polyneuropathy: Secondary | ICD-10-CM | POA: Diagnosis present

## 2012-08-07 DIAGNOSIS — S78119A Complete traumatic amputation at level between unspecified hip and knee, initial encounter: Secondary | ICD-10-CM

## 2012-08-07 DIAGNOSIS — E785 Hyperlipidemia, unspecified: Secondary | ICD-10-CM | POA: Diagnosis present

## 2012-08-07 DIAGNOSIS — I10 Essential (primary) hypertension: Secondary | ICD-10-CM | POA: Diagnosis present

## 2012-08-07 DIAGNOSIS — F17200 Nicotine dependence, unspecified, uncomplicated: Secondary | ICD-10-CM | POA: Diagnosis present

## 2012-08-07 HISTORY — DX: Mixed hyperlipidemia: E78.2

## 2012-08-07 HISTORY — DX: Type 2 diabetes mellitus without complications: E11.9

## 2012-08-07 HISTORY — DX: Unspecified osteoarthritis, unspecified site: M19.90

## 2012-08-07 HISTORY — DX: Peripheral vascular disease, unspecified: I73.9

## 2012-08-07 HISTORY — DX: Type 2 diabetes mellitus with diabetic neuropathy, unspecified: E11.40

## 2012-08-07 HISTORY — DX: Tobacco use: Z72.0

## 2012-08-07 HISTORY — DX: Essential (primary) hypertension: I10

## 2012-08-07 HISTORY — DX: Diverticulitis of intestine, part unspecified, without perforation or abscess without bleeding: K57.92

## 2012-08-07 HISTORY — DX: Atherosclerotic heart disease of native coronary artery without angina pectoris: I25.10

## 2012-08-07 HISTORY — DX: Gastro-esophageal reflux disease without esophagitis: K21.9

## 2012-08-07 HISTORY — DX: Unspecified cataract: H26.9

## 2012-08-07 LAB — GLUCOSE, CAPILLARY
Glucose-Capillary: 146 mg/dL — ABNORMAL HIGH (ref 70–99)
Glucose-Capillary: 221 mg/dL — ABNORMAL HIGH (ref 70–99)
Glucose-Capillary: 106 mg/dL — ABNORMAL HIGH (ref 70–99)

## 2012-08-07 LAB — PROTIME-INR: INR: 2.13 — ABNORMAL HIGH (ref 0.00–1.49)

## 2012-08-07 MED ORDER — ASPIRIN EC 81 MG PO TBEC
81.0000 mg | DELAYED_RELEASE_TABLET | Freq: Every day | ORAL | Status: DC
  Administered 2012-08-08 – 2012-08-10 (×3): 81 mg via ORAL
  Filled 2012-08-07 (×3): qty 1

## 2012-08-07 MED ORDER — SODIUM CHLORIDE 0.9 % IJ SOLN
3.0000 mL | Freq: Two times a day (BID) | INTRAMUSCULAR | Status: DC
  Administered 2012-08-08 – 2012-08-10 (×4): 3 mL via INTRAVENOUS

## 2012-08-07 MED ORDER — INSULIN ASPART 100 UNIT/ML ~~LOC~~ SOLN
0.0000 [IU] | Freq: Three times a day (TID) | SUBCUTANEOUS | Status: DC
  Administered 2012-08-07: 18:00:00 5 [IU] via SUBCUTANEOUS
  Administered 2012-08-08: 09:00:00 3 [IU] via SUBCUTANEOUS
  Administered 2012-08-08 – 2012-08-09 (×3): 2 [IU] via SUBCUTANEOUS
  Administered 2012-08-09: 5 [IU] via SUBCUTANEOUS
  Administered 2012-08-10: 2 [IU] via SUBCUTANEOUS

## 2012-08-07 MED ORDER — SODIUM CHLORIDE 0.9 % IJ SOLN: 3.0000 mL | INTRAMUSCULAR | Status: DC | PRN

## 2012-08-07 MED ORDER — TRAMADOL HCL 50 MG PO TABS
50.0000 mg | ORAL_TABLET | Freq: Four times a day (QID) | ORAL | Status: DC | PRN
  Administered 2012-08-07 – 2012-08-08 (×3): 50 mg via ORAL
  Filled 2012-08-07 (×4): qty 1

## 2012-08-07 MED ORDER — LINAGLIPTIN 5 MG PO TABS
5.0000 mg | ORAL_TABLET | Freq: Every day | ORAL | Status: DC
  Administered 2012-08-07 – 2012-08-10 (×4): 5 mg via ORAL
  Filled 2012-08-07 (×4): qty 1

## 2012-08-07 MED ORDER — ATORVASTATIN CALCIUM 80 MG PO TABS
80.0000 mg | ORAL_TABLET | Freq: Every day | ORAL | Status: DC
  Administered 2012-08-07 – 2012-08-09 (×3): 80 mg via ORAL
  Filled 2012-08-07 (×4): qty 1

## 2012-08-07 MED ORDER — SODIUM CHLORIDE 0.9 % IV SOLN
INTRAVENOUS | Status: DC
  Administered 2012-08-08 – 2012-08-09 (×3): via INTRAVENOUS

## 2012-08-07 MED ORDER — NITROGLYCERIN IN D5W 200-5 MCG/ML-% IV SOLN
3.0000 ug/min | INTRAVENOUS | Status: DC
  Administered 2012-08-07: 15:00:00 5 ug/min via INTRAVENOUS
  Filled 2012-08-07: qty 250

## 2012-08-07 MED ORDER — SODIUM CHLORIDE 0.9 % IV SOLN: 250.0000 mL | INTRAVENOUS | Status: DC | PRN

## 2012-08-07 MED ORDER — NITROGLYCERIN 0.4 MG SL SUBL: 0.4000 mg | SUBLINGUAL_TABLET | SUBLINGUAL | Status: DC | PRN

## 2012-08-07 MED ORDER — METOPROLOL TARTRATE 25 MG PO TABS
25.0000 mg | ORAL_TABLET | Freq: Two times a day (BID) | ORAL | Status: DC
  Administered 2012-08-07 – 2012-08-10 (×6): 25 mg via ORAL
  Filled 2012-08-07 (×7): qty 1

## 2012-08-07 MED ORDER — INSULIN ASPART 100 UNIT/ML ~~LOC~~ SOLN: 0.0000 [IU] | Freq: Every day | SUBCUTANEOUS | Status: DC

## 2012-08-07 MED ORDER — SODIUM CHLORIDE 0.9 % IV SOLN: INTRAVENOUS | Status: DC

## 2012-08-07 MED ORDER — GLIPIZIDE ER 5 MG PO TB24
5.0000 mg | ORAL_TABLET | Freq: Every day | ORAL | Status: DC
  Administered 2012-08-08 – 2012-08-10 (×3): 5 mg via ORAL
  Filled 2012-08-07 (×4): qty 1

## 2012-08-07 MED ORDER — ASPIRIN 81 MG PO CHEW: 324.0000 mg | CHEWABLE_TABLET | ORAL | Status: DC

## 2012-08-07 MED ORDER — AMITRIPTYLINE HCL 50 MG PO TABS
50.0000 mg | ORAL_TABLET | Freq: Every day | ORAL | Status: DC
  Administered 2012-08-07 – 2012-08-09 (×3): 50 mg via ORAL
  Filled 2012-08-07 (×4): qty 1

## 2012-08-07 MED ORDER — PREGABALIN 50 MG PO CAPS
100.0000 mg | ORAL_CAPSULE | Freq: Three times a day (TID) | ORAL | Status: DC
  Administered 2012-08-07 – 2012-08-10 (×9): 100 mg via ORAL
  Filled 2012-08-07 (×2): qty 4
  Filled 2012-08-07 (×4): qty 2
  Filled 2012-08-07: qty 4
  Filled 2012-08-07: qty 1
  Filled 2012-08-07: qty 2
  Filled 2012-08-07: qty 1

## 2012-08-07 MED ORDER — IRBESARTAN 75 MG PO TABS
75.0000 mg | ORAL_TABLET | Freq: Every day | ORAL | Status: DC
  Administered 2012-08-07 – 2012-08-10 (×4): 75 mg via ORAL
  Filled 2012-08-07 (×4): qty 1

## 2012-08-07 MED ORDER — ACETAMINOPHEN 325 MG PO TABS
650.0000 mg | ORAL_TABLET | ORAL | Status: DC | PRN
  Administered 2012-08-09: 650 mg via ORAL
  Filled 2012-08-07: qty 2

## 2012-08-07 MED ORDER — ONDANSETRON HCL 4 MG/2ML IJ SOLN
4.0000 mg | Freq: Four times a day (QID) | INTRAMUSCULAR | Status: DC | PRN
  Administered 2012-08-09: 4 mg via INTRAVENOUS
  Filled 2012-08-07: qty 2

## 2012-08-07 MED ORDER — PANTOPRAZOLE SODIUM 40 MG PO TBEC
40.0000 mg | DELAYED_RELEASE_TABLET | Freq: Every day | ORAL | Status: DC
  Administered 2012-08-07 – 2012-08-09 (×3): 40 mg via ORAL
  Filled 2012-08-07 (×3): qty 1

## 2012-08-08 ENCOUNTER — Encounter (HOSPITAL_COMMUNITY): Payer: Self-pay | Admitting: Cardiology

## 2012-08-08 DIAGNOSIS — I200 Unstable angina: Secondary | ICD-10-CM

## 2012-08-08 LAB — BASIC METABOLIC PANEL
BUN: 14 mg/dL (ref 6–23)
CO2: 24 mEq/L (ref 19–32)
Calcium: 8.2 mg/dL — ABNORMAL LOW (ref 8.4–10.5)
Chloride: 105 mEq/L (ref 96–112)
Creatinine, Ser: 0.94 mg/dL (ref 0.50–1.35)

## 2012-08-08 LAB — GLUCOSE, CAPILLARY
Glucose-Capillary: 144 mg/dL — ABNORMAL HIGH (ref 70–99)
Glucose-Capillary: 147 mg/dL — ABNORMAL HIGH (ref 70–99)

## 2012-08-08 LAB — PROTIME-INR: Prothrombin Time: 20.4 seconds — ABNORMAL HIGH (ref 11.6–15.2)

## 2012-08-08 LAB — HEPARIN LEVEL (UNFRACTIONATED): Heparin Unfractionated: 0.18 IU/mL — ABNORMAL LOW (ref 0.30–0.70)

## 2012-08-08 LAB — LIPID PANEL
Cholesterol: 211 mg/dL — ABNORMAL HIGH (ref 0–200)
Triglycerides: 190 mg/dL — ABNORMAL HIGH (ref <150)

## 2012-08-08 MED ORDER — HEPARIN BOLUS VIA INFUSION: 2000.0000 [IU] | Freq: Once | INTRAVENOUS | Status: DC

## 2012-08-08 MED ORDER — HEPARIN (PORCINE) IN NACL 100-0.45 UNIT/ML-% IJ SOLN
1000.0000 [IU]/h | INTRAMUSCULAR | Status: DC
  Administered 2012-08-08: 07:00:00 1000 [IU]/h via INTRAVENOUS
  Filled 2012-08-08 (×2): qty 250

## 2012-08-08 MED ORDER — HEPARIN (PORCINE) IN NACL 100-0.45 UNIT/ML-% IJ SOLN
1200.0000 [IU]/h | INTRAMUSCULAR | Status: DC
  Administered 2012-08-09: 1200 [IU]/h via INTRAVENOUS
  Filled 2012-08-08 (×5): qty 250

## 2012-08-08 MED ORDER — ASPIRIN 81 MG PO CHEW
324.0000 mg | CHEWABLE_TABLET | ORAL | Status: AC
  Administered 2012-08-10: 324 mg via ORAL
  Filled 2012-08-08: qty 4

## 2012-08-08 NOTE — Progress Notes (Signed)
  ANTICOAGULATION CONSULT NOTE - Initial Consult  Pharmacy Consult for Heparin Indication: h/o DVT  No Known Allergies  Patient Measurements: Weight: 143 lb 11.8 oz (65.2 kg)  Vital Signs: Temp: 97.2 F (36.2 C) (05/03 0456) Temp src: Oral (05/03 0456) BP: 129/82 mmHg (05/03 0456) Pulse Rate: 74 (05/03 0456)  Labs:  Recent Labs  08/07/12 1511 08/08/12 0530  LABPROT 22.9* 20.4*  INR 2.13* 1.82*    CrCl is unknown because no creatinine reading has been taken and the patient has no height on file.   Medical History: Past Medical History  Diagnosis Date  . Chest pain at rest 08/07/2012  . Hypertension   . Shortness of breath   . Diabetes mellitus without complication     type 2  . Peripheral vascular disease   . GERD (gastroesophageal reflux disease)   . Headache   . Neuromuscular disorder     Diabetic neuropathy  . Arthritis     Medications:  Prescriptions prior to admission  Medication Sig Dispense Refill  . amitriptyline (ELAVIL) 50 MG tablet Take 100 mg by mouth at bedtime.      Marland Kitchen aspirin EC 81 MG tablet Take 81 mg by mouth daily.      Marland Kitchen glipiZIDE (GLUCOTROL XL) 5 MG 24 hr tablet Take 5 mg by mouth daily.      Marland Kitchen loratadine (CLARITIN) 10 MG tablet Take 10 mg by mouth daily.      . meloxicam (MOBIC) 15 MG tablet Take 15 mg by mouth daily.      . metFORMIN (GLUCOPHAGE) 500 MG tablet Take 1,000 mg by mouth 2 (two) times daily with a meal.      . metoprolol tartrate (LOPRESSOR) 25 MG tablet Take 25 mg by mouth 2 (two) times daily.      Marland Kitchen omeprazole (PRILOSEC) 20 MG capsule Take 20 mg by mouth 2 (two) times daily.      . pregabalin (LYRICA) 100 MG capsule Take 100 mg by mouth 3 (three) times daily.      . sitaGLIPtin (JANUVIA) 50 MG tablet Take 50 mg by mouth daily.      . traMADol (ULTRAM) 50 MG tablet Take 50 mg by mouth every 6 (six) hours as needed for pain.      . valsartan (DIOVAN) 80 MG tablet Take 80 mg by mouth daily.      Marland Kitchen warfarin (COUMADIN) 3 MG  tablet Take 3 mg by mouth at bedtime.        Assessment: 67 yo male with h/o DVT, Coumadin on hold while awaiting cath on Monday, for heparin  Goal of Therapy:  Heparin level 0.3-0.7 units/ml Monitor platelets by anticoagulation protocol: Yes   Plan:  Start heparin 1000 units/hr Check heparin level in 8 hours.  Eddie Candle 08/08/2012,6:16 AM

## 2012-08-08 NOTE — Progress Notes (Signed)
ANTICOAGULATION CONSULT NOTE - Follow Up Consult  Pharmacy Consult for Heparin Indication: DVT awaiting cath  No Known Allergies  Patient Measurements: Height: 5\' 7"  (170.2 cm) Weight: 143 lb 11.8 oz (65.2 kg) IBW/kg (Calculated) : 66.1 Heparin Dosing Weight:   Vital Signs: Temp: 97.9 F (36.6 C) (05/03 1500) Temp src: Oral (05/03 1500) BP: 154/81 mmHg (05/03 1500) Pulse Rate: 71 (05/03 1500)  Labs:  Recent Labs  08/07/12 1511 08/08/12 0530 08/08/12 1655  LABPROT 22.9* 20.4*  --   INR 2.13* 1.82*  --   HEPARINUNFRC  --   --  0.18*  CREATININE  --  0.94  --     Estimated Creatinine Clearance: 69.4 ml/min (by C-G formula based on Cr of 0.94).  Assessment: USAP  67 yo male with h/o DVT (History of failed emergent bilateral iliofemoral embolectomy in 2006, status post right AKA). Coumadin on hold while awaiting cath on Monday. INR down to 1.82 started on IV heparin. Heparin level 0.18.   Goal of Therapy:  Heparin level 0.3-0.7 units/ml  Monitor platelets by anticoagulation protocol: Yes   Plan:  Increase IV heparin to 1200 units/hr  Recheck heparin level in 6 hrs.   Devyn Griffing S. Merilynn Finland, PharmD, BCPS Clinical Staff Pharmacist Pager [phone]  Misty Stanley Stillinger 08/08/2012,6:14 PM

## 2012-08-08 NOTE — Progress Notes (Signed)
Primary cardiologist: Dr. Nona Dell  Subjective:   No active chest pain. Complains of right leg stump pain, a chronic recurrent problem. No palpitations or breathlessness at rest.   Objective:   Temp:  [97.2 F (36.2 C)-98 F (36.7 C)] 97.5 F (36.4 C) (05/03 0729) Pulse Rate:  [60-88] 69 (05/03 0729) Resp:  [18] 18 (05/03 0729) BP: (108-174)/(50-89) 127/73 mmHg (05/03 0729) SpO2:  [91 %-99 %] 95 % (05/03 0729) Weight:  [143 lb 11.8 oz (65.2 kg)] 143 lb 11.8 oz (65.2 kg) (05/03 0456) Last BM Date: 08/07/12  Filed Weights   08/08/12 0456  Weight: 143 lb 11.8 oz (65.2 kg)    Intake/Output Summary (Last 24 hours) at 08/08/12 0749 Last data filed at 08/08/12 0618  Gross per 24 hour  Intake   1544 ml  Output   2050 ml  Net   -506 ml   Telemetry: Sinus rhythm  Exam:  General: Chronically ill-appearing in no acute distress.  Lungs: Diminished, coarse breath sounds. Nonlabored.  Cardiac: RRR with S4.  Abdomen: NABS.  Extremities: Status post right AKA. Diminished right femoral pulse.  Lab Results:  Basic Metabolic Panel:  Recent Labs Lab 08/08/12 0530  NA 138  K 4.3  CL 105  CO2 24  GLUCOSE 180*  BUN 14  CREATININE 0.94  CALCIUM 8.2*    Coagulation:  Recent Labs Lab 08/07/12 1511 08/08/12 0530  INR 2.13* 1.82*    ECG: Sinus rhythm with no acute ST changes.   Medications:   Scheduled Medications: . amitriptyline  50 mg Oral QHS  . aspirin  324 mg Oral Pre-Cath  . aspirin EC  81 mg Oral Daily  . atorvastatin  80 mg Oral q1800  . glipiZIDE  5 mg Oral Q breakfast  . insulin aspart  0-15 Units Subcutaneous TID WC  . insulin aspart  0-5 Units Subcutaneous QHS  . irbesartan  75 mg Oral Daily  . linagliptin  5 mg Oral Daily  . metoprolol tartrate  25 mg Oral BID  . pantoprazole  40 mg Oral Q1200  . pregabalin  100 mg Oral TID  . sodium chloride  3 mL Intravenous Q12H     Infusions: . sodium chloride    . sodium chloride 75 mL/hr  at 08/08/12 0452  . heparin 1,000 Units/hr (08/08/12 0711)  . nitroGLYCERIN 5 mcg/min (08/07/12 1900)     PRN Medications:  sodium chloride, acetaminophen, nitroGLYCERIN, ondansetron (ZOFRAN) IV, sodium chloride, traMADol   Assessment:   1. Unstable angina. Patient was seen by Dr. Myrtis Ser in consultation at [facility] yesterday and transferred to [facility] in anticipation of cardiac catheterization on Monday. Troponin I levels were normal at [facility]. ECG not acute at this point.  2. CAD documented at cardiac catheterization April 2005, nonobstructive with some vasospasm defined in the LAD - treated medically.  3. History of failed emergent bilateral iliofemoral embolectomy in 2006, status post right AKA. He was has been on Coumadin.  4. Hypertension.  5. Hyperlipidemia, on statin therapy as an outpatient.  6. Ongoing tobacco use.  Plan/Discussion:    The patient is scheduled for diagnostic cardiac catheterization on Monday. Coumadin has been held and he is on heparin infusion. Continue aspirin, Lipitor, metoprolol, irbesartan. Continue Lyrica, as needed pain management. He takes Ultram and meloxicam and home. Followup lab work for the morning including CBC and BMET. He will be transferred out of the 6500 unit to step down.  Jonelle Sidle, M.D., F.A.C.C.

## 2012-08-08 NOTE — Progress Notes (Deleted)
ANTICOAGULATION CONSULT NOTE - Follow Up Consult  Pharmacy Consult for heparin Indication: chest pain/ACS  No Known Allergies  Patient Measurements: Height: 5\' 7"  (170.2 cm) Weight: 143 lb 11.8 oz (65.2 kg) IBW/kg (Calculated) : 66.1   Vital Signs: Temp: 97.9 F (36.6 C) (05/03 1500) Temp src: Oral (05/03 1500) BP: 154/81 mmHg (05/03 1500) Pulse Rate: 71 (05/03 1500)  Labs:  Recent Labs  08/07/12 1511 08/08/12 0530 08/08/12 1655  LABPROT 22.9* 20.4*  --   INR 2.13* 1.82*  --   HEPARINUNFRC  --   --  0.18*  CREATININE  --  0.94  --     Estimated Creatinine Clearance: 69.4 ml/min (by C-G formula based on Cr of 0.94).   Medications:  Scheduled:  . amitriptyline  50 mg Oral QHS  . [START ON 08/10/2012] aspirin  324 mg Oral Pre-Cath  . aspirin EC  81 mg Oral Daily  . atorvastatin  80 mg Oral q1800  . glipiZIDE  5 mg Oral Q breakfast  . insulin aspart  0-15 Units Subcutaneous TID WC  . insulin aspart  0-5 Units Subcutaneous QHS  . irbesartan  75 mg Oral Daily  . linagliptin  5 mg Oral Daily  . metoprolol tartrate  25 mg Oral BID  . pantoprazole  40 mg Oral Q1200  . pregabalin  100 mg Oral TID  . sodium chloride  3 mL Intravenous Q12H  . [DISCONTINUED] aspirin  324 mg Oral Pre-Cath    Assessment: 67 yo male with history of DVT, coumadin on hold while awaiting cath on Monday.  Intitial heparin level is 0.18, last INR noted is 1.82.  Goal of Therapy:  Heparin level 0.3-0.7 units/ml Monitor platelets by anticoagulation protocol: Yes   Plan:   -Heparin 2000 unit bolus then increase infusion to 1200 units/hr -heparin level in 8hrs  Harland German, Pharm D 08/08/2012 6:21 PM

## 2012-08-09 LAB — CBC
Hemoglobin: 10.5 g/dL — ABNORMAL LOW (ref 13.0–17.0)
MCH: 25 pg — ABNORMAL LOW (ref 26.0–34.0)
MCHC: 32.7 g/dL (ref 30.0–36.0)
RDW: 17.1 % — ABNORMAL HIGH (ref 11.5–15.5)

## 2012-08-09 LAB — BASIC METABOLIC PANEL
BUN: 15 mg/dL (ref 6–23)
Calcium: 8.6 mg/dL (ref 8.4–10.5)
GFR calc Af Amer: 87 mL/min — ABNORMAL LOW (ref >90)
GFR calc non Af Amer: 75 mL/min — ABNORMAL LOW (ref >90)
Glucose, Bld: 160 mg/dL — ABNORMAL HIGH (ref 70–99)
Potassium: 4.2 mEq/L (ref 3.5–5.1)
Sodium: 136 mEq/L (ref 135–145)

## 2012-08-09 LAB — GLUCOSE, CAPILLARY
Glucose-Capillary: 247 mg/dL — ABNORMAL HIGH (ref 70–99)
Glucose-Capillary: 140 mg/dL — ABNORMAL HIGH (ref 70–99)

## 2012-08-09 LAB — HEPARIN LEVEL (UNFRACTIONATED): Heparin Unfractionated: 0.47 IU/mL (ref 0.30–0.70)

## 2012-08-09 NOTE — Progress Notes (Signed)
Report from Night RN. Chart reviewed together. Handoff complete.Introductions complete. Will continue to monitor and advise attending as needed.   

## 2012-08-09 NOTE — Progress Notes (Signed)
 SUBJECTIVE:  Still with mild chest soreness.  Better than yesterday however   PHYSICAL EXAM Filed Vitals:   08/09/12 0500 08/09/12 0600 08/09/12 0700 08/09/12 0739  BP: 123/66 121/66 145/54 156/96  Pulse:      Temp:    98 F (36.7 C)  TempSrc:    Oral  Resp:    20  Height:      Weight:      SpO2:    94%   General:  No distress Lungs:  Clear Heart:  RRR, no rub Abdomen:  Positive bowel sounds, no rebound no guarding Extremities:  Good right radial pulse.  LABS: No results found for this basename: CKTOTAL, CKMB, CKMBINDEX, TROPONINI   Results for orders placed during the hospital encounter of 08/07/12 (from the past 24 hour(s))  GLUCOSE, CAPILLARY     Status: Abnormal   Collection Time    08/08/12 11:48 AM      Result Value Range   Glucose-Capillary 144 (*) 70 - 99 mg/dL   Comment 1 Notify RN    GLUCOSE, CAPILLARY     Status: Abnormal   Collection Time    08/08/12  4:21 PM      Result Value Range   Glucose-Capillary 147 (*) 70 - 99 mg/dL  HEPARIN LEVEL (UNFRACTIONATED)     Status: Abnormal   Collection Time    08/08/12  4:55 PM      Result Value Range   Heparin Unfractionated 0.18 (*) 0.30 - 0.70 IU/mL  HEPARIN LEVEL (UNFRACTIONATED)     Status: None   Collection Time    08/08/12 11:41 PM      Result Value Range   Heparin Unfractionated 0.43  0.30 - 0.70 IU/mL  HEPARIN LEVEL (UNFRACTIONATED)     Status: None   Collection Time    08/09/12  4:20 AM      Result Value Range   Heparin Unfractionated 0.47  0.30 - 0.70 IU/mL  CBC     Status: Abnormal   Collection Time    08/09/12  4:20 AM      Result Value Range   WBC 8.2  4.0 - 10.5 K/uL   RBC 4.20 (*) 4.22 - 5.81 MIL/uL   Hemoglobin 10.5 (*) 13.0 - 17.0 g/dL   HCT 32.1 (*) 39.0 - 52.0 %   MCV 76.4 (*) 78.0 - 100.0 fL   MCH 25.0 (*) 26.0 - 34.0 pg   MCHC 32.7  30.0 - 36.0 g/dL   RDW 17.1 (*) 11.5 - 15.5 %   Platelets 251  150 - 400 K/uL  BASIC METABOLIC PANEL     Status: Abnormal   Collection Time   08/09/12  4:20 AM      Result Value Range   Sodium 136  135 - 145 mEq/L   Potassium 4.2  3.5 - 5.1 mEq/L   Chloride 104  96 - 112 mEq/L   CO2 26  19 - 32 mEq/L   Glucose, Bld 160 (*) 70 - 99 mg/dL   BUN 15  6 - 23 mg/dL   Creatinine, Ser 1.00  0.50 - 1.35 mg/dL   Calcium 8.6  8.4 - 10.5 mg/dL   GFR calc non Af Amer 75 (*) >90 mL/min   GFR calc Af Amer 87 (*) >90 mL/min    Intake/Output Summary (Last 24 hours) at 08/09/12 0813 Last data filed at 08/09/12 0700  Gross per 24 hour  Intake 2947.05 ml  Output   2940 ml  Net     7.05 ml   ASSESSMENT AND PLAN:  UNSTABLE ANGINA:  AKA:  Failed iliofemoral embolectomy in 2006 with right AKA.  Currently off warfarin.  On heparin.  Continue IV NTG  TOBACCO ABUSE:  Educated.   DIABETES:  Continue current therapy.   ANEMIA:  Check stool guaiac.   Marvin Nelson 08/09/2012 8:13 AM   

## 2012-08-09 NOTE — Progress Notes (Signed)
  ANTICOAGULATION CONSULT NOTE  Pharmacy Consult for Heparin Indication: h/o DVT  No Known Allergies  Patient Measurements: Height: 5\' 7"  (170.2 cm) Weight: 143 lb 11.8 oz (65.2 kg) IBW/kg (Calculated) : 66.1  Vital Signs: Temp: 98 F (36.7 C) (05/04 0739) Temp src: Oral (05/04 0739) BP: 156/96 mmHg (05/04 0739) Pulse Rate: 71 (05/03 2154)  Labs:  Recent Labs  08/07/12 1511 08/08/12 0530 08/08/12 1655 08/08/12 2341 08/09/12 0420  HGB  --   --   --   --  10.5*  HCT  --   --   --   --  32.1*  PLT  --   --   --   --  251  LABPROT 22.9* 20.4*  --   --   --   INR 2.13* 1.82*  --   --   --   HEPARINUNFRC  --   --  0.18* 0.43 0.47  CREATININE  --  0.94  --   --  1.00    Estimated Creatinine Clearance: 65.2 ml/min (by C-G formula based on Cr of 1).  Assessment: 67 yo male with h/o DVT, Coumadin on hold while awaiting cath on Monday, for heparin.   Heparin continues to be at goal on 1200 units/hr. No bleeding issues noted. Plan for stool guaiac. Hgb 10.5 no comparison available.  Goal of Therapy:  Heparin level 0.3-0.7 units/ml Monitor platelets by anticoagulation protocol: Yes   Plan:  Continue heparin at 1200units/hr Cath Monday  Sheppard Coil PharmD., BCPS Clinical Pharmacist Pager [phone] 08/09/2012 8:55 AM

## 2012-08-09 NOTE — Progress Notes (Signed)
  ANTICOAGULATION CONSULT NOTE  Pharmacy Consult for Heparin Indication: h/o DVT  No Known Allergies  Patient Measurements: Height: 5\' 7"  (170.2 cm) Weight: 143 lb 11.8 oz (65.2 kg) IBW/kg (Calculated) : 66.1  Vital Signs: Temp: 98.1 F (36.7 C) (05/03 2340) Temp src: Other (Comment) (05/03 2340) BP: 176/77 mmHg (05/03 2300) Pulse Rate: 71 (05/03 2154)  Labs:  Recent Labs  08/07/12 1511 08/08/12 0530 08/08/12 1655 08/08/12 2341  LABPROT 22.9* 20.4*  --   --   INR 2.13* 1.82*  --   --   HEPARINUNFRC  --   --  0.18* 0.43  CREATININE  --  0.94  --   --     Estimated Creatinine Clearance: 69.4 ml/min (by C-G formula based on Cr of 0.94).  Assessment: 67 yo male with h/o DVT, Coumadin on hold while awaiting cath on Monday, for heparin  Goal of Therapy:  Heparin level 0.3-0.7 units/ml Monitor platelets by anticoagulation protocol: Yes   Plan:  Continue Heparin at current rate   Marvin Nelson, [employer] 08/09/2012,12:17 AM

## 2012-08-10 ENCOUNTER — Encounter (HOSPITAL_COMMUNITY)
Admission: AD | Disposition: A | Discharge status: Home / Self Care | Payer: Self-pay | Source: Other Acute Inpatient Hospital | Attending: Cardiology

## 2012-08-10 ENCOUNTER — Ambulatory Visit (HOSPITAL_COMMUNITY): Admit: 2012-08-10 | Payer: Self-pay | Admitting: Cardiovascular Disease

## 2012-08-10 ENCOUNTER — Encounter (HOSPITAL_COMMUNITY): Payer: Self-pay | Admitting: Physician Assistant

## 2012-08-10 DIAGNOSIS — I2510 Atherosclerotic heart disease of native coronary artery without angina pectoris: Secondary | ICD-10-CM

## 2012-08-10 HISTORY — PX: LEFT HEART CATHETERIZATION WITH CORONARY ANGIOGRAM: SHX5451

## 2012-08-10 LAB — CBC
HCT: 29.9 % — ABNORMAL LOW (ref 39.0–52.0)
Hemoglobin: 10.3 g/dL — ABNORMAL LOW (ref 13.0–17.0)
MCH: 25.5 pg — ABNORMAL LOW (ref 26.0–34.0)
MCV: 74 fL — ABNORMAL LOW (ref 78.0–100.0)
RBC: 4.04 MIL/uL — ABNORMAL LOW (ref 4.22–5.81)

## 2012-08-10 LAB — HEPATIC FUNCTION PANEL
ALT: 27 U/L (ref 0–53)
Bilirubin, Direct: <0.1 mg/dL (ref 0.0–0.3)
Total Protein: 6.1 g/dL (ref 6.0–8.3)

## 2012-08-10 LAB — PROTIME-INR: INR: 1.13 (ref 0.00–1.49)

## 2012-08-10 LAB — GLUCOSE, CAPILLARY
Glucose-Capillary: 192 mg/dL — ABNORMAL HIGH (ref 70–99)
Glucose-Capillary: 127 mg/dL — ABNORMAL HIGH (ref 70–99)

## 2012-08-10 SURGERY — LEFT HEART CATHETERIZATION WITH CORONARY ANGIOGRAM
Anesthesia: LOCAL

## 2012-08-10 MED ORDER — HEPARIN (PORCINE) IN NACL 2-0.9 UNIT/ML-% IJ SOLN
INTRAMUSCULAR | Status: AC
  Filled 2012-08-10: qty 1000

## 2012-08-10 MED ORDER — LIDOCAINE HCL (PF) 1 % IJ SOLN
INTRAMUSCULAR | Status: AC
  Filled 2012-08-10: qty 30

## 2012-08-10 MED ORDER — MIDAZOLAM HCL 2 MG/2ML IJ SOLN
INTRAMUSCULAR | Status: AC
  Filled 2012-08-10: qty 2

## 2012-08-10 MED ORDER — PRAVASTATIN SODIUM 40 MG PO TABS: 40.0000 mg | ORAL_TABLET | Freq: Every day | ORAL | Status: DC

## 2012-08-10 MED ORDER — HEPARIN SODIUM (PORCINE) 1000 UNIT/ML IJ SOLN
INTRAMUSCULAR | Status: AC
  Filled 2012-08-10: qty 1

## 2012-08-10 MED ORDER — VERAPAMIL HCL 2.5 MG/ML IV SOLN
INTRAVENOUS | Status: AC
  Filled 2012-08-10: qty 2

## 2012-08-10 MED ORDER — METFORMIN HCL 500 MG PO TABS: 1000.0000 mg | ORAL_TABLET | Freq: Two times a day (BID) | ORAL | Status: AC

## 2012-08-10 MED ORDER — FENTANYL CITRATE 0.05 MG/ML IJ SOLN
INTRAMUSCULAR | Status: AC
  Filled 2012-08-10: qty 2

## 2012-08-10 MED ORDER — NITROGLYCERIN 0.4 MG SL SUBL: 0.4000 mg | SUBLINGUAL_TABLET | SUBLINGUAL | Status: AC | PRN

## 2012-08-10 MED ORDER — ATORVASTATIN CALCIUM 80 MG PO TABS: 80.0000 mg | ORAL_TABLET | Freq: Every day | ORAL | Status: DC

## 2012-08-10 MED ORDER — SODIUM CHLORIDE 0.9 % IV SOLN
1.0000 mL/kg/h | INTRAVENOUS | Status: DC
  Administered 2012-08-10: 1 mL/kg/h via INTRAVENOUS

## 2012-08-10 NOTE — CV Procedure (Signed)
   Cardiac Catheterization Procedure Note  Name: Marvin Nelson MRN: [id number] DOB: 1945/10/04  Procedure: Left Heart Cath, Selective Coronary Angiography, LV angiography  Indication: Unstable angina. 67 year old gentleman with severe peripheral arterial disease and previous right above-knee amputation who presented with a constant chest and left upper arm pain concerning for unstable angina. He was referred for cardiac catheterization and possible PCI. His cardiac markers are normal and EKGs are nonrevealing.   Procedural Details: The right wrist was prepped, draped, and anesthetized with 1% lidocaine. Using the modified Seldinger technique, a 5 French sheath was introduced into the right radial artery. 3 mg of verapamil was administered through the sheath, weight-based unfractionated heparin was administered intravenously. Standard Judkins catheters were used for selective coronary angiography and left ventriculography. An AR-1 catheter had to be used for injection of the right coronary artery. Catheter exchanges were performed over an exchange length guidewire. There were no immediate procedural complications. A TR band was used for radial hemostasis at the completion of the procedure.  The patient was transferred to the post catheterization recovery area for further monitoring.  Procedural Findings: Hemodynamics: AO 143/73 with a mean of 102 LV 150/13  Coronary angiography: Coronary dominance: right  Left mainstem: The left main is widely patent with no obstructive disease.  Left anterior descending (LAD): The LAD is segmentally diseased throughout. The proximal vessel has nonobstructive 30-40% stenosis. The mid vessel has diffuse 75% stenosis that improves with intracoronary nitroglycerin to less than 50%. The distal vessel is diffusely diseased with nonobstructive plaque. The diagonal branch is widely patent.  Left circumflex (LCx): The left circumflex is patent throughout. It is a large  caliber vessel with diffuse irregularity. There are 2 large obtuse marginal branches with no stenoses of any significance. The second OM does have scattered 30% stenoses.  Right coronary artery (RCA): The right coronary artery is dominant. It is also diffusely irregular but there are no significant stenoses present. The distal vessel has an eccentric 30% stenosis. The PDA branch is patent without disease. The PDA is large in caliber.  Left ventriculography: Left ventricular systolic function is normal, LVEF is estimated at 55-65%, there is no significant mitral regurgitation   Final Conclusions:   1. Moderate mid LAD stenosis, improved with intracoronary nitroglycerin to less than 50% 2. Minor nonobstructive stenosis in the right coronary artery and left circumflex vessels 3. Normal left ventricular systolic function.  Recommendations: I don't think the patient's prolonged rest pain can be explained on the findings on his coronary angiogram which demonstrates moderate coronary disease of the LAD. Recommend ongoing medical management of nonobstructive CAD. The patient will be discharged later today. He will be restarted on warfarin for his lower extremity PAD.  Tonny Bollman 08/10/2012, 8:23 AM

## 2012-08-10 NOTE — Progress Notes (Signed)
Brief Nutrition Note:   RD consulted for poor po intake. Pt with d/c summary. Per pt/family, pt has  A good appetite, but does not like the foods provided.   No nutrition interventions warranted at this time.    Clarene Duke RD, LDN Pager [phone] After Hours pager [phone]

## 2012-08-10 NOTE — Discharge Summary (Signed)
Discharge Summary   Patient ID: Marvin Nelson MRN: [id number], DOB/AGE: February 26, 1946 66 y.o. Admit date: 08/07/2012 D/C date:     08/10/2012  Primary Cardiologist: Diona Browner in [place]  Primary Discharge Diagnoses:  1. Chest pain, not clearly cardiac 2. CAD - cath 08/10/12: moderate mid LAD stenosis, improved with intracoronary nitroglycerin to less than 50%, minor nonobstructive CAD in RCA/LCx - history of cath 2005 showing some vasospasm in LAD 3. PVD - history of failed emergent bilateral iliofemoral embolectomy in 2006, status post right AKA, on Coumadin 4. HTN 5. HL - statin initiated - consider f/u labs as OP 6. Tobacco use 7. Diabetes mellitus with neuropathy  Secondary Discharge Diagnoses:  1. Cataracts 2. GERD 3. Diverticulitis 4. Arthritis  Hospital Course: Marvin Nelson is a 67 y/o M with history of CAD, PAD, HTN, DM who presented to [facility] with complaints of chest pain. Prior history of CAD includes cath 2005 with minimal fixed CAD with asymptomatic spasm of mid LAD, PVD history includes failed emergent bilateral iliofemoral embolectomy and R AKA in 2006, maintained on Coumadin followed by his PCP. On 08/05/12, he developed L bicep pain that waxed and waned into the following day when subsequently developed chest pressure like an "elephant" sitting on his chest with associated dyspnea. He went to the ER because of this pain. Cardiac enzymes were negative and EKG was nonrevealing but symptoms were felt concerning for [place]. CXR showed no active disease. Coumadin was held, he was transitioned to IV heparin, and he was transferred to [facility] in prep for cath. Cardiac cath this morning demonstrated: 1. Moderate mid LAD stenosis, improved with intracoronary nitroglycerin to less than 50%  2. Minor nonobstructive stenosis in the right coronary artery and left circumflex vessels  3. Normal left ventricular systolic function. Dr. Excell Seltzer did not think the patient's prolonged  rest pain can be explained on the findings on his coronary angiogram which demonstrates moderate coronary disease of the LAD. He recommended ongoing medical management of nonobstructive CAD. LDL was 141 thus statin was added. Lipitor was initially chosen but the patient reported significant leg cramps with this in the past and requested to be restarted on Pravastatin. The patient was restarted on his home Coumadin post-cath and instructed to f/u with his PCP for further monitoring of INR within 2-3 days. His PCP Dr. Margo Common follows his Coumadin level. We will also ask that he contact his PCP at discharge to discuss further evaluation of his anemia as well. Dr. Excell Seltzer has seen and examined the patient today and feels he is stable for discharge.  Discharge Vitals: Blood pressure 123/70, pulse 62, temperature 98.1 F (36.7 C), temperature source Oral, resp. rate 18, height 5\' 7"  (1.702 m), weight 143 lb 11.8 oz (65.2 kg), SpO2 93.00%.  Labs: Lab Results  Component Value Date   WBC 8.7 08/10/2012   HGB 10.3* 08/10/2012   HCT 29.9* 08/10/2012   MCV 74.0* 08/10/2012   PLT 232 08/10/2012     Recent Labs Lab 08/09/12 0420 08/10/12 1110  NA 136  --   K 4.2  --   CL 104  --   CO2 26  --   BUN 15  --   CREATININE 1.00  --   CALCIUM 8.6  --   PROT  --  6.1  BILITOT  --  0.3  ALKPHOS  --  70  ALT  --  27  AST  --  24  GLUCOSE 160*  --  Lab Results  Component Value Date   CHOL 211* 08/08/2012   HDL 32* 08/08/2012   LDLCALC 141* 08/08/2012   TRIG 190* 08/08/2012    Diagnostic Studies/Procedures   Cardiac catheterization this admission, please see full report and above for summary.   Discharge Medications     Medication List    STOP taking these medications       meloxicam 15 MG tablet  Commonly known as:  MOBIC      TAKE these medications       amitriptyline 50 MG tablet  Commonly known as:  ELAVIL  Take 100 mg by mouth at bedtime.     aspirin EC 81 MG tablet  Take 81 mg by mouth  daily.     glipiZIDE 5 MG 24 hr tablet  Commonly known as:  GLUCOTROL XL  Take 5 mg by mouth daily.     loratadine 10 MG tablet  Commonly known as:  CLARITIN  Take 10 mg by mouth daily.     metFORMIN 500 MG tablet  Commonly known as:  GLUCOPHAGE  Take 2 tablets (1,000 mg total) by mouth 2 (two) times daily with a meal.  Start taking on:  08/12/2012     metoprolol tartrate 25 MG tablet  Commonly known as:  LOPRESSOR  Take 25 mg by mouth 2 (two) times daily.     nitroGLYCERIN 0.4 MG SL tablet  Commonly known as:  NITROSTAT  Place 1 tablet (0.4 mg total) under the tongue every 5 (five) minutes as needed for chest pain (up to 3 doses).     omeprazole 20 MG capsule  Commonly known as:  PRILOSEC  Take 20 mg by mouth 2 (two) times daily.     pravastatin 40 MG tablet  Commonly known as:  PRAVACHOL  Take 1 tablet (40 mg total) by mouth daily.     pregabalin 100 MG capsule  Commonly known as:  LYRICA  Take 100 mg by mouth 3 (three) times daily.     sitaGLIPtin 50 MG tablet  Commonly known as:  JANUVIA  Take 50 mg by mouth daily.     traMADol 50 MG tablet  Commonly known as:  ULTRAM  Take 50 mg by mouth every 6 (six) hours as needed for pain.     valsartan 80 MG tablet  Commonly known as:  DIOVAN  Take 80 mg by mouth daily.     warfarin 3 MG tablet  Commonly known as:  COUMADIN  Take 3 mg by mouth at bedtime.      He was instructed to resume Metformin the evening of 5.7.14.  Disposition   The patient will be discharged in stable condition to home. Discharge Orders   Future Appointments Provider Department Dept Phone   09/02/2012 1:00 PM Prescott Parma, PA-C [place] [facility] ([facility] [place]) [phone]   Future Orders Complete By Expires     Diet - low sodium heart healthy  As directed     Discharge instructions  As directed     Comments:      Please contact Dr. Jackolyn Confer office today to schedule Coumadin level to be checked on Wednesday 08/12/12 or Thursday  08/13/12.  Your blood count somewhat decreased. At discharge, please call primary care doctor to discuss further evaluation of your anemia. In the meantime, please stop your Mobic since this medicine can sometimes irritate the stomach lining and increase risk of bleeding while on blood thinners. This can also mimic chest pain in some  patients. Talk to Dr. Margo Common about alternatives.    Increase activity slowly  As directed     Comments:      No lifting over 5 lbs for 1 week. No sexual activity for 1 week.  Keep procedure site clean & dry. If you notice increased pain, swelling, bleeding or pus, call/return!  You may shower, but no soaking baths/hot tubs/pools for 1 week.      Follow-up Information   Follow up with SERPE, EUGENE, PA-C On 09/02/2012. (1pm)    Contact information:   [address], [address] [place] [address] [phone] [facility]      Follow up with TAPPER,DAVID B, MD. (See below in "Discharge Instructions")    Contact information:   [address] [place] [address] [phone]         Duration of Discharge Encounter: Greater than 30 minutes including physician and PA time.  Signed, Harleyquinn Gasser PA-C 08/10/2012, 1:12 PM

## 2012-08-10 NOTE — Interval H&P Note (Signed)
History and Physical Interval Note:  08/10/2012 7:40 AM  Marvin Nelson  has presented today for surgery, with the diagnosis of c/p  The various methods of treatment have been discussed with the patient and family. After consideration of risks, benefits and other options for treatment, the patient has consented to  Procedure(s): LEFT HEART CATHETERIZATION WITH CORONARY ANGIOGRAM (N/A) as a surgical intervention .  The patient's history has been reviewed, patient examined, no change in status, stable for surgery.  I have reviewed the patient's chart and labs.  Questions were answered to the patient's satisfaction.     Tonny Bollman

## 2012-08-10 NOTE — H&P (View-Only) (Signed)
SUBJECTIVE:  Still with mild chest soreness.  Better than yesterday however   PHYSICAL EXAM Filed Vitals:   08/09/12 0500 08/09/12 0600 08/09/12 0700 08/09/12 0739  BP: 123/66 121/66 145/54 156/96  Pulse:      Temp:    98 F (36.7 C)  TempSrc:    Oral  Resp:    20  Height:      Weight:      SpO2:    94%   General:  No distress Lungs:  Clear Heart:  RRR, no rub Abdomen:  Positive bowel sounds, no rebound no guarding Extremities:  Good right radial pulse.  LABS: No results found for this basename: CKTOTAL, CKMB, CKMBINDEX, TROPONINI   Results for orders placed during the hospital encounter of 08/07/12 (from the past 24 hour(s))  GLUCOSE, CAPILLARY     Status: Abnormal   Collection Time    08/08/12 11:48 AM      Result Value Range   Glucose-Capillary 144 (*) 70 - 99 mg/dL   Comment 1 Notify RN    GLUCOSE, CAPILLARY     Status: Abnormal   Collection Time    08/08/12  4:21 PM      Result Value Range   Glucose-Capillary 147 (*) 70 - 99 mg/dL  HEPARIN LEVEL (UNFRACTIONATED)     Status: Abnormal   Collection Time    08/08/12  4:55 PM      Result Value Range   Heparin Unfractionated 0.18 (*) 0.30 - 0.70 IU/mL  HEPARIN LEVEL (UNFRACTIONATED)     Status: None   Collection Time    08/08/12 11:41 PM      Result Value Range   Heparin Unfractionated 0.43  0.30 - 0.70 IU/mL  HEPARIN LEVEL (UNFRACTIONATED)     Status: None   Collection Time    08/09/12  4:20 AM      Result Value Range   Heparin Unfractionated 0.47  0.30 - 0.70 IU/mL  CBC     Status: Abnormal   Collection Time    08/09/12  4:20 AM      Result Value Range   WBC 8.2  4.0 - 10.5 K/uL   RBC 4.20 (*) 4.22 - 5.81 MIL/uL   Hemoglobin 10.5 (*) 13.0 - 17.0 g/dL   HCT 16.1 (*) 09.6 - 04.5 %   MCV 76.4 (*) 78.0 - 100.0 fL   MCH 25.0 (*) 26.0 - 34.0 pg   MCHC 32.7  30.0 - 36.0 g/dL   RDW 40.9 (*) 81.1 - 91.4 %   Platelets 251  150 - 400 K/uL  BASIC METABOLIC PANEL     Status: Abnormal   Collection Time   08/09/12  4:20 AM      Result Value Range   Sodium 136  135 - 145 mEq/L   Potassium 4.2  3.5 - 5.1 mEq/L   Chloride 104  96 - 112 mEq/L   CO2 26  19 - 32 mEq/L   Glucose, Bld 160 (*) 70 - 99 mg/dL   BUN 15  6 - 23 mg/dL   Creatinine, Ser 7.82  0.50 - 1.35 mg/dL   Calcium 8.6  8.4 - 95.6 mg/dL   GFR calc non Af Amer 75 (*) >90 mL/min   GFR calc Af Amer 87 (*) >90 mL/min    Intake/Output Summary (Last 24 hours) at 08/09/12 0813 Last data filed at 08/09/12 0700  Gross per 24 hour  Intake 2947.05 ml  Output   2940 ml  Net  7.05 ml   ASSESSMENT AND PLAN:  UNSTABLE ANGINA:  AKA:  Failed iliofemoral embolectomy in 2006 with right AKA.  Currently off warfarin.  On heparin.  Continue IV NTG  TOBACCO ABUSE:  Educated.   DIABETES:  Continue current therapy.   ANEMIA:  Check stool guaiac.   Fayrene Fearing [facility] 08/09/2012 8:13 AM

## 2012-09-02 ENCOUNTER — Ambulatory Visit (INDEPENDENT_AMBULATORY_CARE_PROVIDER_SITE_OTHER): Payer: Medicare Other | Admitting: Physician Assistant

## 2012-09-02 ENCOUNTER — Encounter: Payer: Self-pay | Admitting: Physician Assistant

## 2012-09-02 VITALS — BP 109/67 | HR 89 | Ht 67.0 in | Wt 134.1 lb

## 2012-09-02 DIAGNOSIS — F17200 Nicotine dependence, unspecified, uncomplicated: Secondary | ICD-10-CM

## 2012-09-02 DIAGNOSIS — E782 Mixed hyperlipidemia: Secondary | ICD-10-CM | POA: Insufficient documentation

## 2012-09-02 DIAGNOSIS — I10 Essential (primary) hypertension: Secondary | ICD-10-CM

## 2012-09-02 DIAGNOSIS — E119 Type 2 diabetes mellitus without complications: Secondary | ICD-10-CM

## 2012-09-02 DIAGNOSIS — Z720 Tobacco use: Secondary | ICD-10-CM

## 2012-09-02 DIAGNOSIS — I2510 Atherosclerotic heart disease of native coronary artery without angina pectoris: Secondary | ICD-10-CM | POA: Insufficient documentation

## 2012-09-02 DIAGNOSIS — I739 Peripheral vascular disease, unspecified: Secondary | ICD-10-CM | POA: Insufficient documentation

## 2012-09-02 NOTE — Progress Notes (Signed)
Primary Cardiologist: Simona Huh, MD   HPI: Post hospital followup from [facility], following transfer from [facility] for further evaluation of symptoms worrisome for UAP. Troponins NL.   Patient presented with history of noncritical CAD; EF 70%, no focal WMAs, by prior catheterization in April 2005. However, he presented with multiple CRFs, including DM and ongoing tobacco smoking, with complaint of recurrent left bicep pain. He also presented status post right AKA, with history of severe PAD.    - Cardiac catheterization, May 5: Moderate mid LAD stenosis (improved with IC NTG to < 50%); minor, nonobstructive RCA and CFX disease; NL LVF (EF 55-65%)  Coumadin anticoagulation was resumed, postprocedure.  Clinically, he reports no exertional CP or recurrent bicep pain, which were his presenting symptoms. He has resumed followup with Dr. Margo Common, for long-standing management of Coumadin anticoagulation. Unfortunately, he continues to smoke.  No Known Allergies  Current Outpatient Prescriptions  Medication Sig Dispense Refill  . amitriptyline (ELAVIL) 50 MG tablet Take 100 mg by mouth at bedtime.      Marland Kitchen aspirin EC 81 MG tablet Take 81 mg by mouth daily.      Marland Kitchen glipiZIDE (GLUCOTROL XL) 5 MG 24 hr tablet Take 5 mg by mouth daily.      Marland Kitchen loratadine (CLARITIN) 10 MG tablet Take 10 mg by mouth daily.      . metFORMIN (GLUCOPHAGE) 500 MG tablet Take 2 tablets (1,000 mg total) by mouth 2 (two) times daily with a meal.      . metoprolol tartrate (LOPRESSOR) 25 MG tablet Take 25 mg by mouth 2 (two) times daily.      . nitroGLYCERIN (NITROSTAT) 0.4 MG SL tablet Place 1 tablet (0.4 mg total) under the tongue every 5 (five) minutes as needed for chest pain (up to 3 doses).  25 tablet  4  . omeprazole (PRILOSEC) 20 MG capsule Take 20 mg by mouth 2 (two) times daily.      . OxyCODONE HCl, Abuse Deter, 5 MG TABA Take 1 tablet by mouth. By mouth every 6 - 8 hours as needed      . pravastatin (PRAVACHOL) 40 MG tablet Take  1 tablet (40 mg total) by mouth daily.  30 tablet  6  . pregabalin (LYRICA) 100 MG capsule Take 100 mg by mouth 3 (three) times daily.      . sitaGLIPtin (JANUVIA) 50 MG tablet Take 50 mg by mouth daily.      . valsartan (DIOVAN) 80 MG tablet Take 80 mg by mouth daily.      Marland Kitchen warfarin (COUMADIN) 3 MG tablet Take 3 mg by mouth at bedtime. MANAGED BY TAPPER       No current facility-administered medications for this visit.    Past Medical History  Diagnosis Date  . Coronary atherosclerosis of native coronary artery     a. Nonobstructive with LAD vasospasm 2005. b. Cath 07/2012 - moderate mid LAD stenosis, for med rx.  . Essential hypertension, benign   . Type 2 diabetes mellitus   . Peripheral vascular disease     Failed right iliofemoral embolectomy 2006 with subsequent right AKA  . GERD (gastroesophageal reflux disease)   . Diabetic neuropathy   . Arthritis   . Mixed hyperlipidemia   . Diverticulitis   . Tobacco abuse   . Cataract     Past Surgical History  Procedure Laterality Date  . Leg amputation above knee Right 2006  . Cholecystectomy  2008  . Knee surgery Left   .  Bilateral cataract surgery      History   Social History  . Marital Status: Married    Spouse Name: N/A    Number of Children: N/A  . Years of Education: N/A   Occupational History  . Not on file.   Social History Main Topics  . Smoking status: Current Every Day Smoker -- 1.00 packs/day for 55 years    Types: Cigarettes  . Smokeless tobacco: Never Used  . Alcohol Use: No     Comment: Quit drinking  1995   . Drug Use: No  . Sexually Active: Not on file   Other Topics Concern  . Not on file   Social History Narrative  . No narrative on file    Family History  Problem Relation Age of Onset  . Heart attack Father     Died age 55    ROS: no nausea, vomiting; no fever, chills; no melena, hematochezia; no claudication  PHYSICAL EXAM: BP 109/67  Pulse 89  Ht 5\' 7"  (1.702 m)  Wt 134 lb  1.9 oz (60.836 kg)  BMI 21 kg/m2  SpO2 96% GENERAL: 67 year old male; NAD HEENT: NCAT, PERRLA, EOMI; sclera clear; no xanthelasma NECK: palpable bilateral carotid pulses, no bruits; no JVD; no TM LUNGS: CTA bilaterally CARDIAC: RRR (S1, S2); no significant murmurs; no rubs or gallops ABDOMEN: soft, non-tender; intact BS EXTREMETIES: Minimally palpable  R RP, no hematoma, R AKA; no significant LE peripheral edema SKIN: warm/dry; no obvious rash/lesions MUSCULOSKELETAL: no joint deformity NEURO: no focal deficit; NL affect   EKG:    ASSESSMENT & PLAN:  Coronary atherosclerosis of native coronary artery Results of recent cardiac catheterization were reviewed with patient and his wife. In light of noted findings, I recommend adjusting his current medication regimen as follows: DC ASA (patient has not been taking for last 2 weeks, having run out), given that he is on long-standing Coumadin anticoagulation, for his severe PAD. Will also wean and DC Lopressor, given no evidence of active angina or cardiomyopathy. Moreover, there is suggestion of possible vasospasm of the LAD, previously noted by initial catheterization in 2005.  Mixed hyperlipidemia Will reassess lipid status in approximately 8 weeks, following recent addition of pravastatin. Aggressive management recommended with target LDL 70 or less, if feasible.  Essential hypertension, benign Well-controlled on current regimen. Given that we are discontinuing Lopressor, would suggest adding Norvasc in near future, if needed. This is particularly given the fact that patient probably has some element of coronary vasospasm.  Tobacco abuse Patient has been strongly advised to stop smoking, and he appears motivated to do so on his own.  Peripheral vascular disease Patient has remained on Coumadin anticoagulation, followed by primary M.D., since undergoing failed emergent bilateral iliofemoral embolectomy in 2006. Will defer to Dr. Margo Common  regarding whether or not he is to remain on this indefinitely. As noted, I have elected to DC ASA, in this context.    Marvin Nelson, PAC

## 2012-09-02 NOTE — Assessment & Plan Note (Signed)
Patient has been strongly advised to stop smoking, and he appears motivated to do so on his own.

## 2012-09-02 NOTE — Assessment & Plan Note (Signed)
Will reassess lipid status in approximately 8 weeks, following recent addition of pravastatin. Aggressive management recommended with target LDL 70 or less, if feasible.

## 2012-09-02 NOTE — Assessment & Plan Note (Signed)
Patient has remained on Coumadin anticoagulation, followed by primary M.D., since undergoing failed emergent bilateral iliofemoral embolectomy in 2006. Will defer to Dr. Margo Common regarding whether or not he is to remain on this indefinitely. As noted, I have elected to DC ASA, in this context.

## 2012-09-02 NOTE — Assessment & Plan Note (Signed)
Results of recent cardiac catheterization were reviewed with patient and his wife. In light of noted findings, I recommend adjusting his current medication regimen as follows: DC ASA (patient has not been taking for last 2 weeks, having run out), given that he is on long-standing Coumadin anticoagulation, for his severe PAD. Will also wean and DC Lopressor, given no evidence of active angina or cardiomyopathy. Moreover, there is suggestion of possible vasospasm of the LAD, previously noted by initial catheterization in 2005.

## 2012-09-02 NOTE — Assessment & Plan Note (Signed)
Well-controlled on current regimen. Given that we are discontinuing Lopressor, would suggest adding Norvasc in near future, if needed. This is particularly given the fact that patient probably has some element of coronary vasospasm.

## 2012-09-02 NOTE — Patient Instructions (Signed)
   Stop Aspirin  Decrease Lopressor to 12.5mg  twice a day  X 1 week, then STOP   Dr. Margo Common to continue to manage cholesterol per patient Continue all other current medications. Your physician wants you to follow up in: 6 months.  You will receive a reminder letter in the mail one-two months in advance.  If you don't receive a letter, please call our office to schedule the follow up appointment

## 2013-03-12 ENCOUNTER — Other Ambulatory Visit (HOSPITAL_COMMUNITY): Payer: Self-pay | Admitting: Physician Assistant

## 2014-03-17 ENCOUNTER — Encounter (HOSPITAL_COMMUNITY): Payer: Self-pay | Admitting: Cardiovascular Disease

## 2018-07-17 ENCOUNTER — Emergency Department (HOSPITAL_COMMUNITY): Payer: Medicare Other

## 2018-07-17 ENCOUNTER — Inpatient Hospital Stay (HOSPITAL_COMMUNITY)
Admission: EM | Admit: 2018-07-17 | Discharge: 2018-07-20 | DRG: 070 | Disposition: A | Discharge status: Skilled Nursing Facility | Payer: Medicare Other | Attending: Internal Medicine | Admitting: Internal Medicine

## 2018-07-17 ENCOUNTER — Encounter (HOSPITAL_COMMUNITY): Payer: Self-pay

## 2018-07-17 ENCOUNTER — Other Ambulatory Visit: Payer: Self-pay

## 2018-07-17 DIAGNOSIS — Z8673 Personal history of transient ischemic attack (TIA), and cerebral infarction without residual deficits: Secondary | ICD-10-CM

## 2018-07-17 DIAGNOSIS — I10 Essential (primary) hypertension: Secondary | ICD-10-CM | POA: Diagnosis present

## 2018-07-17 DIAGNOSIS — Z79899 Other long term (current) drug therapy: Secondary | ICD-10-CM

## 2018-07-17 DIAGNOSIS — I2510 Atherosclerotic heart disease of native coronary artery without angina pectoris: Secondary | ICD-10-CM | POA: Diagnosis not present

## 2018-07-17 DIAGNOSIS — F17210 Nicotine dependence, cigarettes, uncomplicated: Secondary | ICD-10-CM | POA: Diagnosis present

## 2018-07-17 DIAGNOSIS — E119 Type 2 diabetes mellitus without complications: Secondary | ICD-10-CM

## 2018-07-17 DIAGNOSIS — Z7982 Long term (current) use of aspirin: Secondary | ICD-10-CM

## 2018-07-17 DIAGNOSIS — R32 Unspecified urinary incontinence: Secondary | ICD-10-CM | POA: Diagnosis present

## 2018-07-17 DIAGNOSIS — R4189 Other symptoms and signs involving cognitive functions and awareness: Secondary | ICD-10-CM | POA: Diagnosis present

## 2018-07-17 DIAGNOSIS — Z89611 Acquired absence of right leg above knee: Secondary | ICD-10-CM

## 2018-07-17 DIAGNOSIS — E1151 Type 2 diabetes mellitus with diabetic peripheral angiopathy without gangrene: Secondary | ICD-10-CM | POA: Diagnosis present

## 2018-07-17 DIAGNOSIS — Z9114 Patient's other noncompliance with medication regimen: Secondary | ICD-10-CM

## 2018-07-17 DIAGNOSIS — Z7984 Long term (current) use of oral hypoglycemic drugs: Secondary | ICD-10-CM

## 2018-07-17 DIAGNOSIS — E782 Mixed hyperlipidemia: Secondary | ICD-10-CM | POA: Diagnosis present

## 2018-07-17 DIAGNOSIS — Z9111 Patient's noncompliance with dietary regimen: Secondary | ICD-10-CM

## 2018-07-17 DIAGNOSIS — E1165 Type 2 diabetes mellitus with hyperglycemia: Secondary | ICD-10-CM

## 2018-07-17 DIAGNOSIS — R159 Full incontinence of feces: Secondary | ICD-10-CM | POA: Diagnosis present

## 2018-07-17 DIAGNOSIS — E1159 Type 2 diabetes mellitus with other circulatory complications: Secondary | ICD-10-CM

## 2018-07-17 DIAGNOSIS — R4182 Altered mental status, unspecified: Secondary | ICD-10-CM | POA: Diagnosis not present

## 2018-07-17 DIAGNOSIS — R627 Adult failure to thrive: Secondary | ICD-10-CM | POA: Diagnosis present

## 2018-07-17 DIAGNOSIS — M1990 Unspecified osteoarthritis, unspecified site: Secondary | ICD-10-CM | POA: Diagnosis present

## 2018-07-17 DIAGNOSIS — E43 Unspecified severe protein-calorie malnutrition: Secondary | ICD-10-CM

## 2018-07-17 DIAGNOSIS — Z720 Tobacco use: Secondary | ICD-10-CM

## 2018-07-17 DIAGNOSIS — Z7901 Long term (current) use of anticoagulants: Secondary | ICD-10-CM

## 2018-07-17 DIAGNOSIS — G9340 Encephalopathy, unspecified: Principal | ICD-10-CM | POA: Diagnosis present

## 2018-07-17 DIAGNOSIS — E1140 Type 2 diabetes mellitus with diabetic neuropathy, unspecified: Secondary | ICD-10-CM | POA: Diagnosis present

## 2018-07-17 DIAGNOSIS — K219 Gastro-esophageal reflux disease without esophagitis: Secondary | ICD-10-CM | POA: Diagnosis present

## 2018-07-17 DIAGNOSIS — Z681 Body mass index (BMI) 19 or less, adult: Secondary | ICD-10-CM

## 2018-07-17 LAB — COMPREHENSIVE METABOLIC PANEL
ALT: 24 U/L (ref 0–44)
AST: 20 U/L (ref 15–41)
Albumin: 4.2 g/dL (ref 3.5–5.0)
Alkaline Phosphatase: 103 U/L (ref 38–126)
Anion gap: 15 (ref 5–15)
BUN: 18 mg/dL (ref 8–23)
CO2: 22 mmol/L (ref 22–32)
Calcium: 8.9 mg/dL (ref 8.9–10.3)
Chloride: 101 mmol/L (ref 98–111)
Creatinine, Ser: 0.78 mg/dL (ref 0.61–1.24)
GFR calc Af Amer: >60 mL/min (ref >60)
GFR calc non Af Amer: >60 mL/min (ref >60)
Glucose, Bld: 205 mg/dL — ABNORMAL HIGH (ref 70–99)
Potassium: 3.6 mmol/L (ref 3.5–5.1)
Sodium: 138 mmol/L (ref 135–145)
Total Bilirubin: 1.3 mg/dL — ABNORMAL HIGH (ref 0.3–1.2)
Total Protein: 7.7 g/dL (ref 6.5–8.1)

## 2018-07-17 LAB — CBC WITH DIFFERENTIAL/PLATELET
Abs Immature Granulocytes: 0.03 10*3/uL (ref 0.00–0.07)
Basophils Absolute: 0 10*3/uL (ref 0.0–0.1)
Basophils Relative: 0 %
Eosinophils Absolute: 0.1 10*3/uL (ref 0.0–0.5)
Eosinophils Relative: 2 %
HCT: 47.2 % (ref 39.0–52.0)
Hemoglobin: 15.1 g/dL (ref 13.0–17.0)
Immature Granulocytes: 0 %
Lymphocytes Relative: 17 %
Lymphs Abs: 1.3 10*3/uL (ref 0.7–4.0)
MCH: 27.4 pg (ref 26.0–34.0)
MCHC: 32 g/dL (ref 30.0–36.0)
MCV: 85.7 fL (ref 80.0–100.0)
Monocytes Absolute: 0.6 10*3/uL (ref 0.1–1.0)
Monocytes Relative: 8 %
Neutro Abs: 5.3 10*3/uL (ref 1.7–7.7)
Neutrophils Relative %: 73 %
Platelets: 275 10*3/uL (ref 150–400)
RBC: 5.51 MIL/uL (ref 4.22–5.81)
RDW: 19.2 % — ABNORMAL HIGH (ref 11.5–15.5)
WBC: 7.4 10*3/uL (ref 4.0–10.5)
nRBC: 0 % (ref 0.0–0.2)

## 2018-07-17 LAB — VITAMIN B12: Vitamin B-12: 222 pg/mL (ref 180–914)

## 2018-07-17 LAB — PROTIME-INR
INR: 1.8 — ABNORMAL HIGH (ref 0.8–1.2)
Prothrombin Time: 20.7 seconds — ABNORMAL HIGH (ref 11.4–15.2)

## 2018-07-17 LAB — TSH: TSH: 1.462 u[IU]/mL (ref 0.350–4.500)

## 2018-07-17 LAB — ETHANOL: Alcohol, Ethyl (B): <10 mg/dL (ref <10)

## 2018-07-17 LAB — CBG MONITORING, ED: Glucose-Capillary: 204 mg/dL — ABNORMAL HIGH (ref 70–99)

## 2018-07-17 MED ORDER — VITAMIN D (ERGOCALCIFEROL) 1.25 MG (50000 UNIT) PO CAPS
50000.0000 [IU] | ORAL_CAPSULE | ORAL | Status: DC
  Filled 2018-07-17: qty 1

## 2018-07-17 MED ORDER — VITAMIN C 500 MG PO TABS
500.0000 mg | ORAL_TABLET | Freq: Every day | ORAL | Status: DC
  Filled 2018-07-17: qty 1

## 2018-07-17 MED ORDER — ATORVASTATIN CALCIUM 40 MG PO TABS
80.0000 mg | ORAL_TABLET | Freq: Every day | ORAL | Status: DC
  Administered 2018-07-18 – 2018-07-19 (×2): 80 mg via ORAL
  Filled 2018-07-17 (×3): qty 2

## 2018-07-17 MED ORDER — HALOPERIDOL 2 MG PO TABS
2.0000 mg | ORAL_TABLET | Freq: Four times a day (QID) | ORAL | Status: DC | PRN
  Administered 2018-07-17: 23:00:00 2 mg via ORAL
  Filled 2018-07-17: qty 1

## 2018-07-17 MED ORDER — LOSARTAN POTASSIUM 50 MG PO TABS
50.0000 mg | ORAL_TABLET | Freq: Every day | ORAL | Status: DC
  Filled 2018-07-17: qty 1

## 2018-07-17 MED ORDER — SODIUM CHLORIDE 0.9 % IV BOLUS
1000.0000 mL | Freq: Once | INTRAVENOUS | Status: AC
  Administered 2018-07-17: 18:00:00 1000 mL via INTRAVENOUS

## 2018-07-17 MED ORDER — FERROUS SULFATE 325 (65 FE) MG PO TABS
325.0000 mg | ORAL_TABLET | Freq: Every day | ORAL | Status: DC
  Filled 2018-07-17: qty 1

## 2018-07-17 MED ORDER — INSULIN ASPART 100 UNIT/ML ~~LOC~~ SOLN
0.0000 [IU] | Freq: Three times a day (TID) | SUBCUTANEOUS | Status: DC
  Administered 2018-07-18: 3 [IU] via SUBCUTANEOUS

## 2018-07-17 MED ORDER — ONDANSETRON HCL 4 MG PO TABS: 4.0000 mg | ORAL_TABLET | Freq: Four times a day (QID) | ORAL | Status: DC | PRN

## 2018-07-17 MED ORDER — AMITRIPTYLINE HCL 25 MG PO TABS
25.0000 mg | ORAL_TABLET | Freq: Every day | ORAL | Status: DC
  Administered 2018-07-17 – 2018-07-19 (×3): 25 mg via ORAL
  Filled 2018-07-17 (×3): qty 1

## 2018-07-17 MED ORDER — HALOPERIDOL LACTATE 5 MG/ML IJ SOLN
2.0000 mg | Freq: Four times a day (QID) | INTRAMUSCULAR | Status: DC | PRN
  Administered 2018-07-18: 08:00:00 2 mg via INTRAMUSCULAR
  Filled 2018-07-17: qty 1

## 2018-07-17 MED ORDER — PANTOPRAZOLE SODIUM 40 MG PO TBEC
40.0000 mg | DELAYED_RELEASE_TABLET | Freq: Every day | ORAL | Status: DC
  Filled 2018-07-17: qty 1

## 2018-07-17 MED ORDER — ONDANSETRON HCL 4 MG/2ML IJ SOLN: 4.0000 mg | Freq: Four times a day (QID) | INTRAMUSCULAR | Status: DC | PRN

## 2018-07-17 MED ORDER — WARFARIN SODIUM 2 MG PO TABS
4.0000 mg | ORAL_TABLET | Freq: Once | ORAL | Status: AC
  Administered 2018-07-17: 4 mg via ORAL
  Filled 2018-07-17: qty 2

## 2018-07-17 MED ORDER — NICOTINE 21 MG/24HR TD PT24
21.0000 mg | MEDICATED_PATCH | Freq: Every day | TRANSDERMAL | Status: DC
  Administered 2018-07-17 – 2018-07-20 (×4): 21 mg via TRANSDERMAL
  Filled 2018-07-17 (×4): qty 1

## 2018-07-17 MED ORDER — ENSURE ENLIVE PO LIQD
237.0000 mL | Freq: Two times a day (BID) | ORAL | Status: DC
  Administered 2018-07-18 – 2018-07-20 (×4): 237 mL via ORAL

## 2018-07-17 MED ORDER — POLYETHYLENE GLYCOL 3350 17 G PO PACK: 17.0000 g | PACK | Freq: Every day | ORAL | Status: DC | PRN

## 2018-07-17 MED ORDER — SODIUM CHLORIDE 0.9 % IV SOLN
INTRAVENOUS | Status: DC
  Administered 2018-07-17 – 2018-07-18 (×2): via INTRAVENOUS

## 2018-07-17 MED ORDER — ASPIRIN EC 81 MG PO TBEC
81.0000 mg | DELAYED_RELEASE_TABLET | Freq: Every day | ORAL | Status: DC
  Administered 2018-07-19 – 2018-07-20 (×2): 81 mg via ORAL
  Filled 2018-07-17 (×4): qty 1

## 2018-07-17 MED ORDER — INSULIN ASPART 100 UNIT/ML ~~LOC~~ SOLN: 0.0000 [IU] | Freq: Every day | SUBCUTANEOUS | Status: DC

## 2018-07-17 MED ORDER — WARFARIN - PHARMACIST DOSING INPATIENT: Freq: Every day | Status: DC

## 2018-07-17 MED ORDER — QUETIAPINE FUMARATE 25 MG PO TABS
12.5000 mg | ORAL_TABLET | Freq: Two times a day (BID) | ORAL | Status: DC
  Administered 2018-07-17 – 2018-07-18 (×2): 12.5 mg via ORAL
  Filled 2018-07-17 (×2): qty 1

## 2018-07-17 MED ORDER — PREGABALIN 50 MG PO CAPS
100.0000 mg | ORAL_CAPSULE | Freq: Three times a day (TID) | ORAL | Status: DC
  Administered 2018-07-18: 08:00:00 100 mg via ORAL
  Filled 2018-07-17 (×2): qty 2

## 2018-07-17 MED ORDER — WARFARIN SODIUM 2 MG PO TABS: 3.0000 mg | ORAL_TABLET | Freq: Every day | ORAL | Status: DC

## 2018-07-17 NOTE — Progress Notes (Signed)
CSW contacted Pt's wife, Soul Companion, at cell phone #: [phone] to discuss Pt's status. CSW prompted Pt's wife to discuss the changes that she has observed with her husband within the past few days. Pt's wife explains that he is not usually confused and is independent. Pt's wife also stated that pt was illiterate and could not read or write.   Pt's wife explained that the pt has not eaten, drank, or taken any medications in 4 days. Pt's wife goes into detail and verbalized " I cant handle him". Pt's wife states that when she attempts to bring him food, he becomes physically combative. Wife is concerned about being capable of caring for him if he discharges home confused.   CSW will continue to follow Pt for any discharge or social work needs.   Tollie Canada Sherryle Lis LCSWA Transitions of Care  Clinical Social Worker  Ph: [phone]

## 2018-07-17 NOTE — H&P (Addendum)
History and Physical  Marvin Nelson ZOX:[id number] DOB: 11/22/1945 DOA: 07/17/2018  Referring physician: Dr Adriana Simas, ED physician PCP: Louie Boston., MD  Outpatient Specialists: [facility]  Patient Coming From: home  Chief Complaint: Altered mental status  HPI: Marvin Nelson is a 73 y.o. 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  This patient presents to the hospital via EMS secondary to altered mental status.  History is obtained to the wife as the patient is unable/unwilling to give history.  The patient was admitted 3/14 secondary to severe a aphasia and altered mental status.  Patient underwent extensive testing including 2 MRIs, CTs angiogram, EEG, echo.  The MRI showed an old stroke.  The patient was started on Seroquel due to combativeness and appeared to be improving, whether due to the Seroquel or spontaneously is unknown.  Patient was discharged on 3/17 and was sent to rehab until 3/25.  On discharge, the patient was walking, talking, eating, and taking his medications.  Patient had a follow-up appointment with the neurologist, [facility] Case, NP, who decreased his Seroquel from 25 mg twice daily to 12.5 mg twice daily.  Patient continues to have no problems until 4/7.  He had a follow-up with his primary care physician that day.  Patient went to bed around 5 PM got up at midnight and was incontinent of stool.  The patient was taken to [facility] rocking him ED and was evaluated there, as the patient appeared to be mildly confused and unwilling to take his medications.  Patient was evaluated, then sent home, his wife being told that there was nothing wrong with him.  She talked with the PCP on 4/8 who instructed to take him back to the ED.  She did, but took him home after work-up was reportedly negative.  Has the patient has no  intake of food or water or medications since 4/7, the patient's wife was worried about him today and called EMS to take him to the hospital.  Patient was extremely combative with EMS, to the point that police were called and the patient was brought to the emergency department.  Here, the patient answers minimally to questions, mostly "no".  He did receive some IV fluids, but pulled his IV line out and will not leave telemetry stickers on.  Patient declines eating and/or drinking.  He also declines taking medications.  Per wife: No fevers, chills, nausea, vomiting, headaches, vision changes   Emergency Department Course: CT of the head was negative.  X-ray normal.  Patient unwilling to give urinalysis.  CBC, CMP normal  Review of Systems:  Unable to obtain  Past Medical History:  Diagnosis Date  . Arthritis   . Cataract   . Coronary atherosclerosis of native coronary artery    a. Nonobstructive with LAD vasospasm 2005. b. Cath 07/2012 - moderate mid LAD stenosis, for med rx.  . Diabetic neuropathy (HCC)   . Diverticulitis   . Essential hypertension, benign   . GERD (gastroesophageal reflux disease)   . Mixed hyperlipidemia   . 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 Tobacco abuse   . Type 2 diabetes mellitus (HCC)    Past Surgical History:  Procedure Laterality Date  . Bilateral cataract surgery    . CHOLECYSTECTOMY  2008  . KNEE SURGERY  Left   . LEFT HEART CATHETERIZATION WITH CORONARY ANGIOGRAM N/A 08/10/2012   Procedure: LEFT HEART CATHETERIZATION WITH CORONARY ANGIOGRAM;  Surgeon: Tonny Bollman, MD;  Location: [facility] CATH LAB;  Service: Cardiovascular;  Laterality: N/A;  . LEG AMPUTATION ABOVE KNEE Right 2006   Social History:  reports that he has been smoking cigarettes. He has a 55.00 pack-year smoking history. He has never used smokeless tobacco. He reports that he does not drink alcohol or use drugs. Patient  lives at home  No Known Allergies  Family History  Problem Relation Age of Onset  . Heart attack Father        Died age 62      Prior to Admission medications   Medication Sig Start Date End Date Taking? Authorizing Provider  amitriptyline (ELAVIL) 25 MG tablet Take 25 mg by mouth at bedtime.    Yes [provider]  aspirin EC 81 MG tablet Take 81 mg by mouth daily.   Yes [provider]  atorvastatin (LIPITOR) 80 MG tablet Take 80 mg by mouth daily.   Yes [provider]  ergocalciferol (VITAMIN D2) 1.25 MG (50000 UT) capsule Take 50,000 Units by mouth once a week.   Yes [provider]  ferrous sulfate 325 (65 FE) MG tablet Take 325 mg by mouth daily with breakfast.   Yes [provider]  glipiZIDE (GLUCOTROL XL) 5 MG 24 hr tablet Take 5 mg by mouth daily.   Yes [provider]  losartan (COZAAR) 50 MG tablet Take 50 mg by mouth daily.   Yes [provider]  metFORMIN (GLUCOPHAGE) 500 MG tablet Take 2 tablets (1,000 mg total) by mouth 2 (two) times daily with a meal. 08/12/12  Yes Dunn, Dayna N, PA-C  nicotine (NICODERM CQ - DOSED IN MG/24 HOURS) 21 mg/24hr patch Place 21 mg onto the skin daily.   Yes [provider]  nitroGLYCERIN (NITROSTAT) 0.4 MG SL tablet Place 1 tablet (0.4 mg total) under the tongue every 5 (five) minutes as needed for chest pain (up to 3 doses). 08/10/12  Yes Dunn, Dayna N, PA-C  omeprazole (PRILOSEC) 20 MG capsule Take 20 mg by mouth 2 (two) times daily before a meal.    Yes [provider]  pregabalin (LYRICA) 150 MG capsule Take 100 mg by mouth 3 (three) times daily.    Yes [provider]  QUEtiapine (SEROQUEL) 25 MG tablet Take 25 mg by mouth 2 (two) times daily. 07/01/18  Yes [provider]  vitamin C (ASCORBIC ACID) 500 MG tablet Take 500 mg by mouth daily.   Yes [provider]  warfarin (COUMADIN) 3 MG tablet Take 3 mg by mouth at bedtime. MANAGED BY  TAPPER   Yes [provider]    Physical Exam: BP (!) 190/97   Pulse 91   Temp 97.9 F (36.6 C) (Oral)   Resp 17   Ht  (1.702 m)   Wt 60.8 kg   SpO2 99%   BMI 20.99 kg/m   . General: Elderly Caucasian male. Awake and alert and oriented x person. No acute cardiopulmonary distress.  Marland Kitchen HEENT: Normocephalic atraumatic.  Right and left ears normal in appearance.  Pupils equal, round, reactive to light. Extraocular muscles are intact. Sclerae anicteric and noninjected.  Moist mucosal membranes. No mucosal lesions.  . Neck: Neck supple without lymphadenopathy. No carotid bruits. No masses palpated.  . Cardiovascular: Regular rate with normal S1-S2 sounds. No murmurs, rubs, gallops auscultated. No  JVD.  . Respiratory: Good respiratory effort with no wheezes, rales, rhonchi. Lungs clear to auscultation bilaterally.  No accessory muscle use. . Abdomen: Soft, mildly tender throughout, but without rebound or guarding. Nondistended. Active bowel sounds. No masses or hepatosplenomegaly  . Skin: No rashes, lesions, or ulcerations.  Dry, warm to touch. 2+ dorsalis pedis and radial pulses. . Musculoskeletal: No calf or leg pain. All major joints not erythematous nontender.  No upper or lower joint deformation.  Good ROM.  No contractures  . Psychiatric: Unable to determine judgment or insight. Pleasant, but not cooperative. . Neurologic: Patient not cooperative with exam, however no focal neurological deficits.   Cranial nerves II through XII are grossly intact.           Labs on Admission: I have personally reviewed following labs and imaging studies  CBC: Recent Labs  Lab 07/17/18 1845  WBC 7.4  NEUTROABS 5.3  HGB 15.1  HCT 47.2  MCV 85.7  PLT 275   Basic Metabolic Panel: Recent Labs  Lab 07/17/18 1845  NA 138  K 3.6  CL 101  CO2 22  GLUCOSE 205*  BUN 18  CREATININE 0.78  CALCIUM 8.9   GFR: Estimated Creatinine Clearance: 69.7 mL/min (by C-G formula based on SCr  of 0.78 mg/dL). Liver Function Tests: Recent Labs  Lab 07/17/18 1845  AST 20  ALT 24  ALKPHOS 103  BILITOT 1.3*  PROT 7.7  ALBUMIN 4.2   No results for input(s): LIPASE, AMYLASE in the last 168 hours. No results for input(s): AMMONIA in the last 168 hours. Coagulation Profile: No results for input(s): INR, PROTIME in the last 168 hours. Cardiac Enzymes: No results for input(s): CKTOTAL, CKMB, CKMBINDEX, TROPONINI in the last 168 hours. BNP (last 3 results) No results for input(s): PROBNP in the last 8760 hours. HbA1C: No results for input(s): HGBA1C in the last 72 hours. CBG: Recent Labs  Lab 07/17/18 1747  GLUCAP 204*   Lipid Profile: No results for input(s): CHOL, HDL, LDLCALC, TRIG, CHOLHDL, LDLDIRECT in the last 72 hours. Thyroid Function Tests: Recent Labs    07/17/18 1845  TSH 1.462   Anemia Panel: No results for input(s): VITAMINB12, FOLATE, FERRITIN, TIBC, IRON, RETICCTPCT in the last 72 hours. Urine analysis: No results found for: COLORURINE, APPEARANCEUR, LABSPEC, PHURINE, GLUCOSEU, HGBUR, BILIRUBINUR, KETONESUR, PROTEINUR, UROBILINOGEN, NITRITE, LEUKOCYTESUR Sepsis Labs: @LABRCNTIP (procalcitonin:4,lacticidven:4) )No results found for this or any previous visit (from the past 240 hour(s)).   Radiological Exams on Admission: Ct Head Wo Contrast  Result Date: 07/17/2018 CLINICAL DATA:  Altered mental status. EXAM: CT HEAD WITHOUT CONTRAST TECHNIQUE: Contiguous axial images were obtained from the base of the skull through the vertex without intravenous contrast. COMPARISON:  Head CT dated 07/15/2018. Brain MRI dated 07/15/2018. FINDINGS: Brain: Generalized age related parenchymal volume loss with commensurate dilatation of the ventricles and sulci. Chronic small vessel ischemic changes again noted within the bilateral periventricular and subcortical white matter regions. No mass, hemorrhage, edema or other evidence of acute parenchymal abnormality. No extra-axial  hemorrhage.Lower portions of the brain are difficult to definitively characterize due to patient motion artifact. Vascular: Chronic calcified atherosclerotic changes of the large vessels at the skull base. No unexpected hyperdense vessel. Skull: Normal. Negative for fracture or focal lesion. Sinuses/Orbits: No acute finding, characterization limited by patient motion artifact. Other: None. IMPRESSION: 1. No acute findings. No intracranial mass, hemorrhage or edema. Mild study limitations detailed above. 2. Atrophy and chronic ischemic changes in the white matter. Electronically Signed  By: Bary Richard M.D.   On: 07/17/2018 18:53   Dg Chest Port 1 View  Result Date: 07/17/2018 CLINICAL DATA:  Altered mental status EXAM: PORTABLE CHEST 1 VIEW COMPARISON:  07/15/2018 FINDINGS: The heart size and mediastinal contours are within normal limits. Both lungs are clear. The visualized skeletal structures are unremarkable. IMPRESSION: No active disease. Electronically Signed   By: Deatra Robinson M.D.   On: 07/17/2018 18:37    Assessment/Plan: Principal Problem:   Encephalopathy acute Active Problems:   Coronary atherosclerosis of native coronary artery   Essential hypertension, benign   Type 2 diabetes mellitus (HCC)   Tobacco abuse   Mixed hyperlipidemia   Failure to thrive in adult    This patient was discussed with the ED physician, including pertinent vitals, physical exam findings, labs, and imaging.  We also discussed care given by the ED provider.  1. Encephalopathy a. At this point, the patient's etiology of his encephalopathy is unclear.  There has been no fevers or complaints of headaches that make me suspicious of a viral meningitis. b. Since of work-up at [facility] already done, including EEG, MRI, echocardiogram c. TSH done here is normal d. Negative alcohol level e. Check vitamin B12 level f. We will restart Seroquel, as patient had relatively good success on this regimen.  If patient  unwilling to take Seroquel, may be able to give patient Haldol IM g. Start IV fluids h. At this point, I do not feel that the patient is competent to make an informed decision about his health care.  2. CAD a. Continue aspirin b. Continue antilipid therapy 3. Hypertension a. We will reorder home medications 4. Type 2 diabetes a. Hold oral hypoglycemics b. CBGs before meals and nightly c. Sliding scale insulin 5. Tobacco abuse 6. Hyperlipidemia 7. Failure to thrive a. Ensure between meals  DVT prophylaxis: Patient on Coumadin -will check INR Consultants: None Code Status: Full code confirmed by wife Family Communication: Discussed patient with wife on the phone Disposition Plan: I am uncertain as to the correct placement for this patient this time.  Will observe the patient for now   Levie Heritage, DO

## 2018-07-17 NOTE — Progress Notes (Signed)
Unable to give pt all ordered PM meds. Pt begins to spit meds back out. Will continue to encourage.

## 2018-07-17 NOTE — ED Notes (Signed)
Dr. Stinson at bedside.  

## 2018-07-17 NOTE — Progress Notes (Signed)
ANTICOAGULATION CONSULT NOTE - Follow Up Consult  Pharmacy Consult for warfarin Indication: VTE  No Known Allergies  Patient Measurements: Height: 5\' 7"  (170.2 cm) Weight: 134 lb 0.6 oz (60.8 kg) IBW/kg (Calculated) : 66.1   Vital Signs: Temp: 97.9 F (36.6 C) (04/10 1631) Temp Source: Oral (04/10 1631) BP: 159/79 (04/10 2030) Pulse Rate: 88 (04/10 2030)  Labs: Recent Labs    07/17/18 1845  HGB 15.1  HCT 47.2  PLT 275  LABPROT 20.7*  INR 1.8*  CREATININE 0.78    Estimated Creatinine Clearance: 69.7 mL/min (by C-G formula based on SCr of 0.78 mg/dL).   Medications:  Medications Prior to Admission  Medication Sig Dispense Refill Last Dose  . amitriptyline (ELAVIL) 25 MG tablet Take 25 mg by mouth at bedtime.    unknown  . aspirin EC 81 MG tablet Take 81 mg by mouth daily.   unknown  . atorvastatin (LIPITOR) 80 MG tablet Take 80 mg by mouth daily.   unknown  . ergocalciferol (VITAMIN D2) 1.25 MG (50000 UT) capsule Take 50,000 Units by mouth once a week.   unknown  . ferrous sulfate 325 (65 FE) MG tablet Take 325 mg by mouth daily with breakfast.   unknown  . glipiZIDE (GLUCOTROL XL) 5 MG 24 hr tablet Take 5 mg by mouth daily.   unknown  . losartan (COZAAR) 50 MG tablet Take 50 mg by mouth daily.   unknown  . metFORMIN (GLUCOPHAGE) 500 MG tablet Take 2 tablets (1,000 mg total) by mouth 2 (two) times daily with a meal.   unknown  . nicotine (NICODERM CQ - DOSED IN MG/24 HOURS) 21 mg/24hr patch Place 21 mg onto the skin daily.   unknown  . nitroGLYCERIN (NITROSTAT) 0.4 MG SL tablet Place 1 tablet (0.4 mg total) under the tongue every 5 (five) minutes as needed for chest pain (up to 3 doses). 25 tablet 4 unknown  . omeprazole (PRILOSEC) 20 MG capsule Take 20 mg by mouth 2 (two) times daily before a meal.    unknow  . pregabalin (LYRICA) 150 MG capsule Take 100 mg by mouth 3 (three) times daily.    unknown  . QUEtiapine (SEROQUEL) 25 MG tablet Take 25 mg by mouth 2 (two)  times daily.   unknown  . vitamin C (ASCORBIC ACID) 500 MG tablet Take 500 mg by mouth daily.   unknown  . warfarin (COUMADIN) 3 MG tablet Take 3 mg by mouth at bedtime. MANAGED BY TAPPER   unknown   Scheduled:  . amitriptyline  25 mg Oral QHS  . aspirin EC  81 mg Oral Daily  . atorvastatin  80 mg Oral Daily  . ergocalciferol  50,000 Units Oral Weekly  . [START ON 07/18/2018] feeding supplement (ENSURE ENLIVE)  237 mL Oral BID BM  . [START ON 07/18/2018] ferrous sulfate  325 mg Oral Q breakfast  . [START ON 07/18/2018] insulin aspart  0-15 Units Subcutaneous TID WC  . insulin aspart  0-5 Units Subcutaneous QHS  . losartan  50 mg Oral Daily  . nicotine  21 mg Transdermal Daily  . pantoprazole  40 mg Oral Daily  . pregabalin  100 mg Oral TID  . QUEtiapine  12.5 mg Oral BID  . vitamin C  500 mg Oral Daily  . warfarin  4 mg Oral Once  . [START ON 07/18/2018] Warfarin - Pharmacist Dosing Inpatient   Does not apply q1800   Infusions:  . sodium chloride  PRN: haloperidol **OR** haloperidol lactate, ondansetron **OR** ondansetron (ZOFRAN) IV, polyethylene glycol Anti-infectives (From admission, onward)   None      Assessment: 73 year old male admitted with baseline inr 1.7, on warfarin PTA with goal 2-3 for coronary artery disease or peripheral vascular disease. Home dose of warfarin is 3mg  po daily, last dose unknown.  Goal of Therapy:  INR 2-3 Monitor platelets by anticoagulation protocol: Yes   Plan:  Warfarin 4mg  po x 1 dose tonight INR and CBC in AM  Monitor for signs and symptoms of bleeding   Gerre Pebbles Dorin Stooksbury 07/17/2018,9:42 PM

## 2018-07-17 NOTE — ED Notes (Addendum)
Pt will not keep monitor leads, pulse ox, or BP cuff on- restless in bed- easily redirected when told to lay back down and rest. Pt alert and able to state his name

## 2018-07-17 NOTE — ED Provider Notes (Addendum)
[facility] EMERGENCY DEPARTMENT Provider Note   CSN: [id number] Arrival date & time: 07/17/18  1620    History   Chief Complaint Chief Complaint  Patient presents with  . Altered Mental Status    HPI Marvin Nelson is a 73 y.o. male.     Level 5 caveat for altered mental status.  History obtained from wife Misty Stanley at [phone].  She states her husband was normally high functioning until March 13 when he became confused.  He was seen at [facility] in [place], [place] and admitted to [facility] in [place] for several days.  Ultimately, he was discharged home.  Wife reports his behavior is still not normal.  He has been incontinent of feces and urine.  He has not been eating or drinking for the past several days.  He has been very confused.  He specifically does not have any somatic complaints.  Patient has complicated past medical history including type 2 diabetes, tobacco abuse,     Past Medical History:  Diagnosis Date  . Arthritis   . Cataract   . Coronary atherosclerosis of native coronary artery    a. Nonobstructive with LAD vasospasm 2005. b. Cath 07/2012 - moderate mid LAD stenosis, for med rx.  . Diabetic neuropathy (HCC)   . Diverticulitis   . Essential hypertension, benign   . GERD (gastroesophageal reflux disease)   . Mixed hyperlipidemia   . 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 Tobacco abuse   . Type 2 diabetes mellitus [facility])     Patient Active Problem List   Diagnosis Date Noted  . Coronary atherosclerosis of native coronary artery   . Essential hypertension, benign   . Type 2 diabetes mellitus (HCC)   . Peripheral vascular disease (HCC)   . Tobacco abuse   . Mixed hyperlipidemia     Past Surgical History:  Procedure Laterality Date  . Bilateral cataract surgery    . CHOLECYSTECTOMY  2008  . KNEE SURGERY Left   . LEFT HEART CATHETERIZATION WITH CORONARY ANGIOGRAM N/A  08/10/2012   Procedure: LEFT HEART CATHETERIZATION WITH CORONARY ANGIOGRAM;  Surgeon: Tonny Bollman, MD;  Location: [facility] CATH LAB;  Service: Cardiovascular;  Laterality: N/A;  . LEG AMPUTATION ABOVE KNEE Right 2006        Home Medications    Prior to Admission medications   Medication Sig Start Date End Date Taking? Authorizing Provider  amitriptyline (ELAVIL) 25 MG tablet Take 25 mg by mouth at bedtime.    Yes [provider]  aspirin EC 81 MG tablet Take 81 mg by mouth daily.   Yes [provider]  atorvastatin (LIPITOR) 80 MG tablet Take 80 mg by mouth daily.   Yes [provider]  ergocalciferol (VITAMIN D2) 1.25 MG (50000 UT) capsule Take 50,000 Units by mouth once a week.   Yes [provider]  ferrous sulfate 325 (65 FE) MG tablet Take 325 mg by mouth daily with breakfast.   Yes [provider]  glipiZIDE (GLUCOTROL XL) 5 MG 24 hr tablet Take 5 mg by mouth daily.   Yes [provider]  losartan (COZAAR) 50 MG tablet Take 50 mg by mouth daily.   Yes [provider]  metFORMIN (GLUCOPHAGE) 500 MG tablet Take 2 tablets (1,000 mg total) by mouth 2 (two) times daily with a meal. 08/12/12  Yes Dunn, Dayna N, PA-C  nicotine (NICODERM CQ - DOSED  IN MG/24 HOURS) 21 mg/24hr patch Place 21 mg onto the skin daily.   Yes [provider]  nitroGLYCERIN (NITROSTAT) 0.4 MG SL tablet Place 1 tablet (0.4 mg total) under the tongue every 5 (five) minutes as needed for chest pain (up to 3 doses). 08/10/12  Yes Dunn, Dayna N, PA-C  omeprazole (PRILOSEC) 20 MG capsule Take 20 mg by mouth 2 (two) times daily before a meal.    Yes [provider]  pregabalin (LYRICA) 150 MG capsule Take 100 mg by mouth 3 (three) times daily.    Yes [provider]  QUEtiapine (SEROQUEL) 25 MG tablet Take 25 mg by mouth 2 (two) times daily. 07/01/18  Yes [provider]  vitamin C (ASCORBIC ACID) 500 MG tablet Take 500 mg by mouth  daily.   Yes [provider]  warfarin (COUMADIN) 3 MG tablet Take 3 mg by mouth at bedtime. MANAGED BY TAPPER   Yes [provider]    Family History Family History  Problem Relation Age of Onset  . Heart attack Father        Died age 74    Social History Social History   Tobacco Use  . Smoking status: Current Every Day Smoker    Packs/day: 1.00    Years: 55.00    Pack years: 55.00    Types: Cigarettes  . Smokeless tobacco: Never Used  Substance Use Topics  . Alcohol use: No    Comment: Quit drinking  1995   . Drug use: No     Allergies   Patient has no known allergies.   Review of Systems Review of Systems  All other systems reviewed and are negative.    Physical Exam Updated Vital Signs BP (!) 197/89 Comment: pt moving arm- will not keep BP cuff or monitoring leads on  Pulse 91   Temp 97.9 F (36.6 C) (Oral)   Resp 17   Ht 5\' 7"  (1.702 m)   Wt 60.8 kg   SpO2 99%   BMI 20.99 kg/m   Physical Exam Vitals signs and nursing note reviewed.  Constitutional:      Appearance: He is well-developed.     Comments: Confused.  Does not know person, place, time  HENT:     Head: Normocephalic and atraumatic.  Eyes:     Conjunctiva/sclera: Conjunctivae normal.  Neck:     Musculoskeletal: Neck supple.  Cardiovascular:     Rate and Rhythm: Normal rate and regular rhythm.  Pulmonary:     Effort: Pulmonary effort is normal.     Breath sounds: Normal breath sounds.  Abdominal:     General: Bowel sounds are normal.     Palpations: Abdomen is soft.  Musculoskeletal:     Comments: Right AKA  Skin:    General: Skin is warm and dry.  Neurological:     Comments: Confused; moves all extremities  Psychiatric:     Comments: Flat affect      ED Treatments / Results  Labs (all labs ordered are listed, but only abnormal results are displayed) Labs Reviewed  CBC WITH DIFFERENTIAL/PLATELET - Abnormal; Notable for the following components:       Result Value   RDW 19.2 (*)    All other components within normal limits  COMPREHENSIVE METABOLIC PANEL - Abnormal; Notable for the following components:   Glucose, Bld 205 (*)    Total Bilirubin 1.3 (*)    All other components within normal limits  CBG MONITORING, ED -  Abnormal; Notable for the following components:   Glucose-Capillary 204 (*)    All other components within normal limits  TSH  ETHANOL  URINALYSIS, ROUTINE W REFLEX MICROSCOPIC  RAPID URINE DRUG SCREEN, HOSP PERFORMED    EKG None  Radiology Ct Head Wo Contrast  Result Date: 07/17/2018 CLINICAL DATA:  Altered mental status. EXAM: CT HEAD WITHOUT CONTRAST TECHNIQUE: Contiguous axial images were obtained from the base of the skull through the vertex without intravenous contrast. COMPARISON:  Head CT dated 07/15/2018. Brain MRI dated 07/15/2018. FINDINGS: Brain: Generalized age related parenchymal volume loss with commensurate dilatation of the ventricles and sulci. Chronic small vessel ischemic changes again noted within the bilateral periventricular and subcortical white matter regions. No mass, hemorrhage, edema or other evidence of acute parenchymal abnormality. No extra-axial hemorrhage.Lower portions of the brain are difficult to definitively characterize due to patient motion artifact. Vascular: Chronic calcified atherosclerotic changes of the large vessels at the skull base. No unexpected hyperdense vessel. Skull: Normal. Negative for fracture or focal lesion. Sinuses/Orbits: No acute finding, characterization limited by patient motion artifact. Other: None. IMPRESSION: 1. No acute findings. No intracranial mass, hemorrhage or edema. Mild study limitations detailed above. 2. Atrophy and chronic ischemic changes in the white matter. Electronically Signed   By: Bary Richard M.D.   On: 07/17/2018 18:53   Dg Chest Port 1 View  Result Date: 07/17/2018 CLINICAL DATA:  Altered mental status EXAM: PORTABLE CHEST 1 VIEW  COMPARISON:  07/15/2018 FINDINGS: The heart size and mediastinal contours are within normal limits. Both lungs are clear. The visualized skeletal structures are unremarkable. IMPRESSION: No active disease. Electronically Signed   By: Deatra Robinson M.D.   On: 07/17/2018 18:37    Procedures Procedures (including critical care time)  Medications Ordered in ED Medications  sodium chloride 0.9 % bolus 1,000 mL (0 mLs Intravenous Paused 07/17/18 1800)     Initial Impression / Assessment and Plan / ED Course  I have reviewed the triage vital signs and the nursing notes.  Pertinent labs & imaging results that were available during my care of the patient were reviewed by me and considered in my medical decision making (see chart for details).      Patient with multiple comorbidities presents with persistent confusion over the past month.  Discussed with his wife.  Will obtain screening labs, urinalysis, chest x-ray, CT head.  Probable admission.    Final Clinical Impressions(s) / ED Diagnoses   Final diagnoses:  Altered mental state    ED Discharge Orders    None       Donnetta Hutching, MD 07/17/18 1813    Donnetta Hutching, MD 07/17/18 2015

## 2018-07-17 NOTE — ED Notes (Signed)
Pt to radiology.

## 2018-07-17 NOTE — ED Triage Notes (Signed)
Patient arrived from home via [facility] for AMS x3 days. Patient was seen and d/c from [facility] 07/15/2018 for AMS. Per EMS on arrival patient was combative while leaving the house.   Patient alert to name and birthday. Patient is not oriented to place or time. Patient able to follow commands with assistance.

## 2018-07-17 NOTE — ED Notes (Signed)
ED TO INPATIENT HANDOFF REPORT  ED Nurse Name and Phone #: C Aidan Caloca  S Name/Age/Gender Marvin Nelson 73 y.o. male Room/Bed: APA14/APA14  Code Status   Code Status: Not on file  Home/SNF/Other Home Patient oriented to: self Is this baseline? No   Triage Complete: Triage complete  Chief Complaint Altered mental status   Triage Note Patient arrived from home via [facility] for AMS x3 days. Patient was seen and d/c from [facility] 07/15/2018 for AMS. Per EMS on arrival patient was combative while leaving the house.   Patient alert to name and birthday. Patient is not oriented to place or time. Patient able to follow commands with assistance.   Allergies No Known Allergies  Level of Care/Admitting Diagnosis ED Disposition    ED Disposition Condition Comment   Admit  Hospital Area: [facility] [[id number]]  Level of Care: Med-Surg [16]  Diagnosis: Encephalopathy acute [161096]  Admitting Physician: Levie Heritage [[id number]]  Attending Physician: Levie Heritage [[id number]]  PT Class (Do Not Modify): Observation [104]  PT Acc Code (Do Not Modify): Observation [10022]       B Medical/Surgery History Past Medical History:  Diagnosis Date  . Arthritis   . Cataract   . Coronary atherosclerosis of native coronary artery    a. Nonobstructive with LAD vasospasm 2005. b. Cath 07/2012 - moderate mid LAD stenosis, for med rx.  . Diabetic neuropathy (HCC)   . Diverticulitis   . Essential hypertension, benign   . GERD (gastroesophageal reflux disease)   . Mixed hyperlipidemia   . 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 Tobacco abuse   . Type 2 diabetes mellitus (HCC)    Past Surgical History:  Procedure Laterality Date  . Bilateral cataract surgery    . CHOLECYSTECTOMY  2008  . KNEE SURGERY Left   . LEFT HEART CATHETERIZATION WITH CORONARY ANGIOGRAM N/A 08/10/2012   Procedure: LEFT HEART CATHETERIZATION WITH  CORONARY ANGIOGRAM;  Surgeon: Tonny Bollman, MD;  Location: [facility] CATH LAB;  Service: Cardiovascular;  Laterality: N/A;  . LEG AMPUTATION ABOVE KNEE Right 2006     A IV Location/Drains/Wounds Patient Lines/Drains/Airways Status   Active Line/Drains/Airways    Name:   Placement date:   Placement time:   Site:   Days:   Peripheral IV 07/17/18 Left Forearm   07/17/18    2128    Forearm   less than 1          Intake/Output Last 24 hours No intake or output data in the 24 hours ending 07/17/18 2129  Labs/Imaging Results for orders placed or performed during the hospital encounter of 07/17/18 (from the past 48 hour(s))  POC CBG, ED     Status: Abnormal   Collection Time: 07/17/18  5:47 PM  Result Value Ref Range   Glucose-Capillary 204 (H) 70 - 99 mg/dL  CBC with Differential     Status: Abnormal   Collection Time: 07/17/18  6:45 PM  Result Value Ref Range   WBC 7.4 4.0 - 10.5 K/uL   RBC 5.51 4.22 - 5.81 MIL/uL   Hemoglobin 15.1 13.0 - 17.0 g/dL   HCT 04.5 40.9 - 81.1 %   MCV 85.7 80.0 - 100.0 fL   MCH 27.4 26.0 - 34.0 pg   MCHC 32.0 30.0 - 36.0 g/dL   RDW 91.4 (H) 78.2 - 95.6 %   Platelets 275 150 - 400 K/uL   nRBC  0.0 0.0 - 0.2 %   Neutrophils Relative % 73 %   Neutro Abs 5.3 1.7 - 7.7 K/uL   Lymphocytes Relative 17 %   Lymphs Abs 1.3 0.7 - 4.0 K/uL   Monocytes Relative 8 %   Monocytes Absolute 0.6 0.1 - 1.0 K/uL   Eosinophils Relative 2 %   Eosinophils Absolute 0.1 0.0 - 0.5 K/uL   Basophils Relative 0 %   Basophils Absolute 0.0 0.0 - 0.1 K/uL   Immature Granulocytes 0 %   Abs Immature Granulocytes 0.03 0.00 - 0.07 K/uL    Comment: Performed at [facility], [address] [place], [place] [address]  Comprehensive metabolic panel     Status: Abnormal   Collection Time: 07/17/18  6:45 PM  Result Value Ref Range   Sodium 138 135 - 145 mmol/L   Potassium 3.6 3.5 - 5.1 mmol/L   Chloride 101 98 - 111 mmol/L   CO2 22 22 - 32 mmol/L   Glucose, Bld 205 (H) 70 - 99 mg/dL    BUN 18 8 - 23 mg/dL   Creatinine, Ser 6.040.78 0.61 - 1.24 mg/dL   Calcium 8.9 8.9 - 54.010.3 mg/dL   Total Protein 7.7 6.5 - 8.1 g/dL   Albumin 4.2 3.5 - 5.0 g/dL   AST 20 15 - 41 U/L   ALT 24 0 - 44 U/L   Alkaline Phosphatase 103 38 - 126 U/L   Total Bilirubin 1.3 (H) 0.3 - 1.2 mg/dL   GFR calc non Af Amer >60 >60 mL/min   GFR calc Af Amer >60 >60 mL/min   Anion gap 15 5 - 15    Comment: Performed at [facility], [address] [place], [place] [address]  TSH     Status: None   Collection Time: 07/17/18  6:45 PM  Result Value Ref Range   TSH 1.462 0.350 - 4.500 uIU/mL    Comment: Performed by a 3rd Generation assay with a functional sensitivity of <=0.01 uIU/mL. Performed at [facility], [address] [place], [place] [address]   Ethanol     Status: None   Collection Time: 07/17/18  6:45 PM  Result Value Ref Range   Alcohol, Ethyl (B) <10 <10 mg/dL    Comment: (NOTE) Lowest detectable limit for serum alcohol is 10 mg/dL. For medical purposes only. Performed at [facility], [address] [place], [place] [address]   Protime-INR     Status: Abnormal   Collection Time: 07/17/18  6:45 PM  Result Value Ref Range   Prothrombin Time 20.7 (H) 11.4 - 15.2 seconds   INR 1.8 (H) 0.8 - 1.2    Comment: (NOTE) INR goal varies based on device and disease states. Performed at [facility], [address] [place], [place] [address]    Ct Head Wo Contrast  Result Date: 07/17/2018 CLINICAL DATA:  Altered mental status. EXAM: CT HEAD WITHOUT CONTRAST TECHNIQUE: Contiguous axial images were obtained from the base of the skull through the vertex without intravenous contrast. COMPARISON:  Head CT dated 07/15/2018. Brain MRI dated 07/15/2018. FINDINGS: Brain: Generalized age related parenchymal volume loss with commensurate dilatation of the ventricles and sulci. Chronic small vessel ischemic changes again noted within the bilateral periventricular and subcortical white matter regions. No mass,  hemorrhage, edema or other evidence of acute parenchymal abnormality. No extra-axial hemorrhage.Lower portions of the brain are difficult to definitively characterize due to patient motion artifact. Vascular: Chronic calcified atherosclerotic changes of the large vessels at the  skull base. No unexpected hyperdense vessel. Skull: Normal. Negative for fracture or focal lesion. Sinuses/Orbits: No acute finding, characterization limited by patient motion artifact. Other: None. IMPRESSION: 1. No acute findings. No intracranial mass, hemorrhage or edema. Mild study limitations detailed above. 2. Atrophy and chronic ischemic changes in the white matter. Electronically Signed   By: Bary Richard M.D.   On: 07/17/2018 18:53   Dg Chest Port 1 View  Result Date: 07/17/2018 CLINICAL DATA:  Altered mental status EXAM: PORTABLE CHEST 1 VIEW COMPARISON:  07/15/2018 FINDINGS: The heart size and mediastinal contours are within normal limits. Both lungs are clear. The visualized skeletal structures are unremarkable. IMPRESSION: No active disease. Electronically Signed   By: Deatra Robinson M.D.   On: 07/17/2018 18:37    Pending Labs Unresulted Labs (From admission, onward)    Start     Ordered   07/17/18 1725  Urine rapid drug screen (hosp performed)  ONCE - STAT,   STAT     07/17/18 1725   07/17/18 1721  Urinalysis, Routine w reflex microscopic  ONCE - STAT,   STAT     07/17/18 1725   Signed and Held  Vitamin B12  Once,   R     Signed and Held   Signed and Held  Protime-INR  Daily,   R     Signed and Held          Vitals/Pain Today's Vitals   07/17/18 1901 07/17/18 1913 07/17/18 2000 07/17/18 2030  BP: (!) 197/89  (!) 190/97 (!) 159/79  Pulse: 91  91 88  Resp: 17  17 17   Temp:      TempSrc:      SpO2: 99%  99% 99%  Weight:      Height:      PainSc:  0-No pain      Isolation Precautions No active isolations  Medications Medications  sodium chloride 0.9 % bolus 1,000 mL ( Intravenous Restarted  07/17/18 2128)    Mobility non-ambulatory High fall risk   Focused Assessments    R Recommendations: See Admitting Provider Note  Report given to:   Additional Notes:

## 2018-07-18 DIAGNOSIS — E1165 Type 2 diabetes mellitus with hyperglycemia: Secondary | ICD-10-CM

## 2018-07-18 DIAGNOSIS — R627 Adult failure to thrive: Secondary | ICD-10-CM

## 2018-07-18 DIAGNOSIS — G9340 Encephalopathy, unspecified: Secondary | ICD-10-CM | POA: Diagnosis not present

## 2018-07-18 DIAGNOSIS — I10 Essential (primary) hypertension: Secondary | ICD-10-CM | POA: Diagnosis not present

## 2018-07-18 DIAGNOSIS — I2510 Atherosclerotic heart disease of native coronary artery without angina pectoris: Secondary | ICD-10-CM | POA: Diagnosis not present

## 2018-07-18 LAB — GLUCOSE, CAPILLARY
Glucose-Capillary: 168 mg/dL — ABNORMAL HIGH (ref 70–99)
Glucose-Capillary: 167 mg/dL — ABNORMAL HIGH (ref 70–99)
Glucose-Capillary: 103 mg/dL — ABNORMAL HIGH (ref 70–99)
Glucose-Capillary: 168 mg/dL — ABNORMAL HIGH (ref 70–99)

## 2018-07-18 LAB — BASIC METABOLIC PANEL
Anion gap: 13 (ref 5–15)
BUN: 15 mg/dL (ref 8–23)
CO2: 19 mmol/L — ABNORMAL LOW (ref 22–32)
Calcium: 8 mg/dL — ABNORMAL LOW (ref 8.9–10.3)
Chloride: 106 mmol/L (ref 98–111)
Creatinine, Ser: 0.69 mg/dL (ref 0.61–1.24)
GFR calc Af Amer: >60 mL/min (ref >60)
GFR calc non Af Amer: >60 mL/min (ref >60)
Glucose, Bld: 188 mg/dL — ABNORMAL HIGH (ref 70–99)
Potassium: 3.4 mmol/L — ABNORMAL LOW (ref 3.5–5.1)
Sodium: 138 mmol/L (ref 135–145)

## 2018-07-18 LAB — CBC
HCT: 41 % (ref 39.0–52.0)
Hemoglobin: 13.1 g/dL (ref 13.0–17.0)
MCH: 27 pg (ref 26.0–34.0)
MCHC: 32 g/dL (ref 30.0–36.0)
MCV: 84.5 fL (ref 80.0–100.0)
Platelets: 219 10*3/uL (ref 150–400)
RBC: 4.85 MIL/uL (ref 4.22–5.81)
RDW: 18.7 % — ABNORMAL HIGH (ref 11.5–15.5)
WBC: 7.4 10*3/uL (ref 4.0–10.5)
nRBC: 0 % (ref 0.0–0.2)

## 2018-07-18 LAB — URINALYSIS, ROUTINE W REFLEX MICROSCOPIC
Bacteria, UA: NONE SEEN
Bilirubin Urine: NEGATIVE
Glucose, UA: >=500 mg/dL — AB
Hgb urine dipstick: NEGATIVE
Ketones, ur: 80 mg/dL — AB
Leukocytes,Ua: NEGATIVE
Nitrite: NEGATIVE
Protein, ur: NEGATIVE mg/dL
Specific Gravity, Urine: 1.015 (ref 1.005–1.030)
pH: 5 (ref 5.0–8.0)

## 2018-07-18 LAB — IRON AND TIBC
Iron: 63 ug/dL (ref 45–182)
Saturation Ratios: 20 % (ref 17.9–39.5)
TIBC: 320 ug/dL (ref 250–450)
UIBC: 257 ug/dL

## 2018-07-18 LAB — PROTIME-INR
INR: 2.1 — ABNORMAL HIGH (ref 0.8–1.2)
Prothrombin Time: 23.4 seconds — ABNORMAL HIGH (ref 11.4–15.2)

## 2018-07-18 LAB — FOLATE: Folate: 18.9 ng/mL (ref >5.9)

## 2018-07-18 LAB — RAPID URINE DRUG SCREEN, HOSP PERFORMED
Amphetamines: NOT DETECTED
Barbiturates: NOT DETECTED
Benzodiazepines: NOT DETECTED
Cocaine: NOT DETECTED
Opiates: NOT DETECTED
Tetrahydrocannabinol: NOT DETECTED

## 2018-07-18 LAB — FERRITIN: Ferritin: 94 ng/mL (ref 24–336)

## 2018-07-18 LAB — AMMONIA: Ammonia: 24 umol/L (ref 9–35)

## 2018-07-18 MED ORDER — OLANZAPINE 5 MG PO TBDP
5.0000 mg | ORAL_TABLET | Freq: Two times a day (BID) | ORAL | Status: DC
  Administered 2018-07-18 – 2018-07-20 (×5): 5 mg via ORAL
  Filled 2018-07-18 (×10): qty 1

## 2018-07-18 MED ORDER — WARFARIN SODIUM 2 MG PO TABS
3.0000 mg | ORAL_TABLET | Freq: Once | ORAL | Status: AC
  Administered 2018-07-18: 3 mg via ORAL
  Filled 2018-07-18: qty 1

## 2018-07-18 MED ORDER — PREGABALIN 75 MG PO CAPS
150.0000 mg | ORAL_CAPSULE | Freq: Two times a day (BID) | ORAL | Status: DC
  Administered 2018-07-18 – 2018-07-20 (×4): 150 mg via ORAL
  Filled 2018-07-18 (×4): qty 2

## 2018-07-18 MED ORDER — INSULIN ASPART 100 UNIT/ML ~~LOC~~ SOLN
0.0000 [IU] | Freq: Three times a day (TID) | SUBCUTANEOUS | Status: DC
  Administered 2018-07-18: 2 [IU] via SUBCUTANEOUS
  Administered 2018-07-19: 5 [IU] via SUBCUTANEOUS
  Administered 2018-07-19: 09:00:00 2 [IU] via SUBCUTANEOUS
  Administered 2018-07-19: 17:00:00 3 [IU] via SUBCUTANEOUS
  Administered 2018-07-20: 08:00:00 5 [IU] via SUBCUTANEOUS

## 2018-07-18 MED ORDER — POTASSIUM CHLORIDE IN NACL 20-0.9 MEQ/L-% IV SOLN
INTRAVENOUS | Status: AC
  Administered 2018-07-18 (×2): via INTRAVENOUS

## 2018-07-18 MED ORDER — HYDRALAZINE HCL 20 MG/ML IJ SOLN
5.0000 mg | Freq: Four times a day (QID) | INTRAMUSCULAR | Status: DC | PRN
  Administered 2018-07-19: 06:00:00 5 mg via INTRAVENOUS
  Filled 2018-07-18: qty 1

## 2018-07-18 MED ORDER — HALOPERIDOL 2 MG PO TABS: 2.0000 mg | ORAL_TABLET | ORAL | Status: DC | PRN

## 2018-07-18 MED ORDER — POTASSIUM CHLORIDE 10 MEQ/100ML IV SOLN
10.0000 meq | Freq: Once | INTRAVENOUS | Status: AC
  Administered 2018-07-18: 10 meq via INTRAVENOUS
  Filled 2018-07-18: qty 100

## 2018-07-18 MED ORDER — ACETAMINOPHEN 325 MG PO TABS: 650.0000 mg | ORAL_TABLET | Freq: Four times a day (QID) | ORAL | Status: DC | PRN

## 2018-07-18 MED ORDER — HALOPERIDOL LACTATE 5 MG/ML IJ SOLN: 2.0000 mg | Freq: Four times a day (QID) | INTRAMUSCULAR | Status: DC | PRN

## 2018-07-18 NOTE — Progress Notes (Signed)
Tried to give patient his AM meds. Was only able to give Seroquel and Lyrica and IV Haldol. Will continue to try/encourage the other meds.

## 2018-07-18 NOTE — Progress Notes (Signed)
PROGRESS NOTE  Marvin Nelson L Marvin Nelson:[id number] DOB: 04/10/1945 DOA: 07/17/2018 PCP: [name], Shayne Diguglielmo B., MD  Brief History:  73 year old male with a history of stroke, peripheral vascular disease status post right BKA on chronic anticoagulation with warfarin, diabetes mellitus, hypertension, hyperlipidemia, coronary artery disease presenting with agitation and altered mental status.  The patient had a recent hospitalization from 06/20/2018 through 06/25/2018 at [facility] for aphasia and altered mental status.  The patient underwent extensive work-up including 2 separate MRIs of the brain on 06/20/2018 a.m. 06/24/2018 which did not reveal any acute findings.  The patient also had a CT angiogram of the head and neck which revealed a normal vascular study.  In addition, he underwent video EEG monitoring over 48 hours which did not show any focal abnormalities nor any epileptiform discharges.  It showed moderate generalized slowing.  Echocardiogram with a bubble study was obtained and did not show any cardiac etiology for possible stroke.  EF was 55-60% with grade 1 diastolic dysfunction and without any WMA.  Clinically, it was determined that the patient likely had a TIA versus a small stroke by clinical assessment that was not visible on MRI.  It was felt that the underlying etiology of the TIA/stroke was due to small vessel disease.  Aspirin 81 mg was added to the patient's home warfarin.  He was noted to have LDL of 136, and atorvastatin 80 mg was started during that hospitalization.His hemoglobin A1c was noted to be 8.5 during that hospital admission.During that hospitalization, the patient was treated for iron deficiency anemia when he was noted to have iron saturation 5% with serum iron 17.  He received 5 days of Ferrlecit.  Ultimately, the patient was started on Seroquel 25 mg twice daily for his agitation.  [facility] was subsequently [facility] where  he was ultimately discharged on 07/01/2018.  The patient subsequently followed up with his primary care provider on 07/14/2018.  According to the office note, the patient was at his baseline at that time.   Unfortunately, on the evening of 07/14/2018, the patient woke up in the night incontinent of stool.  The patient was [facility] and d/ced home in stable condition.  Since that period of time, the patient has had essentially no oral intake.  The patient continued to remain combative.  As result, EMS was activated on 07/17/2018.  Apparently, the patient was so agitated that the assistance of police was required to bring patient to the emergency department. In the emergency department, the patient was afebrile hemodynamically stable saturating 100% on room air.  BMP, LFTs, and CBC were essentially unremarkable.  CT of the brain was negative and chest x-ray was unremarkable.  Assessment/Plan: Acute Encephalopathy, type unspecified -pt has had extensive work up as discussed above -serum B12--222 -check RPR -check ammonia -UA -06/21/18 folic acid--16.9 -check thiamine -07/17/18 CT brain negative -pt likely has progression of his underlying cognitive impairment exacerbated by recent FTT and refusal to eat and take medications -Patient remains confused and agitated  Diabetes mellitus type 2, uncontrolled with hyperglycemia -06/20/2018 hemoglobin A1c 8.4 -NovoLog sliding scale  Essential hypertension -Given the patient's refusal to take oral medications, temporarily plan to transition to IV medications  Failure to thrive in adult -PT evaluation -Judicious IV fluids -Work-up as above -TSH 1.462 -He continues to refuse any oral intake -Continue Ensure  Peripheral vascular disease -Status post right  AKA -Patient has been on warfarin for over 10 years which will be continued -INR 2.1 on 07/18/2018  Tobacco abuse -Continue NicoDerm patch -Patient has not smoked since  discharge from his recent hospitalization in March 2020 -He has been wearing the NicoDerm patch at home  Hyperlipidemia -Continue statin if the patient has able and willing to take  Recent TIA/stroke -As discussed above during his hospitalization in March 2020 at [facility] -Continue aspirin if patient is willing and able to take      Disposition Plan:   SNF in 2-3 days  Family Communication:   Spouse updated on phone 4/11  Consultants:  none  Code Status:  FULL   DVT Prophylaxis:  warfarin   Procedures: As Listed in Progress Note Above  Antibiotics: None   Total time spent 45 minutes.  Greater than 50% spent face to face counseling and coordinating care.     Subjective: Pt is awake and alert and intermittently answers question.  He follows commands.  Denies f/c, cp, sob, abd pain, n/v/d Remainder ROS unobtainable due to his agitation and confusion.  Objective: Vitals:   07/17/18 2000 07/17/18 2030 07/17/18 2200 07/18/18 0523  BP: (!) 190/97 (!) 159/79 (!) 173/85 (!) 165/85  Pulse: 91 88 86 92  Resp: Temp:   97.8 F (36.6 C) 98.5 F (36.9 C)  TempSrc:   Oral Oral  SpO2: 99% 99% 100% 97%  Weight:   48.6 kg   Height:    (1.676 m)     Intake/Output Summary (Last 24 hours) at 07/18/2018 0641 Last data filed at 07/18/2018 0500 Gross per 24 hour  Intake 790.21 ml  Output --  Net 790.21 ml   Weight change:  Exam:   General:  Pt is alert, follows commands intermittently, not in acute distress  HEENT: No icterus, No thrush, No neck mass, [place]/AT  Cardiovascular: RRR, S1/S2, no rubs, no gallops  Respiratory: diminished breath sounds and bibasilar rales. No wheezing  Abdomen: Soft/+BS, non tender, non distended, no guarding  Extremities: No edema, No lymphangitis, No petechiae, No rashes, no synovitis   Data Reviewed: I have personally reviewed following labs and imaging studies Basic Metabolic Panel: Recent Labs   Lab 07/17/18 1845  NA 138  K 3.6  CL 101  CO2 22  GLUCOSE 205*  BUN 18  CREATININE 0.78  CALCIUM 8.9   Liver Function Tests: Recent Labs  Lab 07/17/18 1845  AST 20  ALT 24  ALKPHOS 103  BILITOT 1.3*  PROT 7.7  ALBUMIN 4.2   No results for input(s): LIPASE, AMYLASE in the last 168 hours. No results for input(s): AMMONIA in the last 168 hours. Coagulation Profile: Recent Labs  Lab 07/17/18 1845  INR 1.8*   CBC: Recent Labs  Lab 07/17/18 1845  WBC 7.4  NEUTROABS 5.3  HGB 15.1  HCT 47.2  MCV 85.7  PLT 275   Cardiac Enzymes: No results for input(s): CKTOTAL, CKMB, CKMBINDEX, TROPONINI in the last 168 hours. BNP: Invalid input(s): POCBNP CBG: Recent Labs  Lab 07/17/18 1747  GLUCAP 204*   HbA1C: No results for input(s): HGBA1C in the last 72 hours. Urine analysis: No results found for: COLORURINE, APPEARANCEUR, LABSPEC, PHURINE, GLUCOSEU, HGBUR, BILIRUBINUR, KETONESUR, PROTEINUR, UROBILINOGEN, NITRITE, LEUKOCYTESUR Sepsis Labs: (procalcitonin:4,lacticidven:4) )No results found for this or any previous visit (from the past 240 hour(s)).   Scheduled Meds:  amitriptyline  25 mg Oral QHS   aspirin EC  81  mg Oral Daily   atorvastatin  80 mg Oral q1800   feeding supplement (ENSURE ENLIVE)  237 mL Oral BID BM   ferrous sulfate  325 mg Oral Q breakfast   insulin aspart  0-15 Units Subcutaneous TID WC   insulin aspart  0-5 Units Subcutaneous QHS   losartan  50 mg Oral Daily   nicotine  21 mg Transdermal Daily   pantoprazole  40 mg Oral Daily   pregabalin  100 mg Oral TID   QUEtiapine  12.5 mg Oral BID   vitamin C  500 mg Oral Daily   Vitamin D (Ergocalciferol)  50,000 Units Oral Weekly   Warfarin - Pharmacist Dosing Inpatient   Does not apply q1800   Continuous Infusions:  sodium chloride 100 mL/hr at 07/18/18 0500    Procedures/Studies: Ct Head Wo Contrast  Result Date: 07/17/2018 CLINICAL DATA:  Altered mental status.  EXAM: CT HEAD WITHOUT CONTRAST TECHNIQUE: Contiguous axial images were obtained from the base of the skull through the vertex without intravenous contrast. COMPARISON:  Head CT dated 07/15/2018. Brain MRI dated 07/15/2018. FINDINGS: Brain: Generalized age related parenchymal volume loss with commensurate dilatation of the ventricles and sulci. Chronic small vessel ischemic changes again noted within the bilateral periventricular and subcortical white matter regions. No mass, hemorrhage, edema or other evidence of acute parenchymal abnormality. No extra-axial hemorrhage.Lower portions of the brain are difficult to definitively characterize due to patient motion artifact. Vascular: Chronic calcified atherosclerotic changes of the large vessels at the skull base. No unexpected hyperdense vessel. Skull: Normal. Negative for fracture or focal lesion. Sinuses/Orbits: No acute finding, characterization limited by patient motion artifact. Other: None. IMPRESSION: 1. No acute findings. No intracranial mass, hemorrhage or edema. Mild study limitations detailed above. 2. Atrophy and chronic ischemic changes in the white matter. Electronically Signed   By: Bary Richard M.D.   On: 07/17/2018 18:53   Dg Chest Port 1 View  Result Date: 07/17/2018 CLINICAL DATA:  Altered mental status EXAM: PORTABLE CHEST 1 VIEW COMPARISON:  07/15/2018 FINDINGS: The heart size and mediastinal contours are within normal limits. Both lungs are clear. The visualized skeletal structures are unremarkable. IMPRESSION: No active disease. Electronically Signed   By: Deatra Robinson M.D.   On: 07/17/2018 18:37    Catarina Hartshorn, DO  [facility] Pager [phone]  If 7PM-7AM, please contact night-coverage [email or web address] Password [email or web address] 07/18/2018, 6:42 AM   LOS: 0 days

## 2018-07-18 NOTE — Progress Notes (Signed)
ANTICOAGULATION CONSULT NOTE - Follow Up Consult  Pharmacy Consult for warfarin Indication: VTE  No Known Allergies  Patient Measurements: Height: 5\' 6"  (167.6 cm) Weight: 107 lb 2.3 oz (48.6 kg) IBW/kg (Calculated) : 63.8   Vital Signs: Temp: 98.5 F (36.9 C) (04/11 0523) Temp Source: Oral (04/11 0523) BP: 165/85 (04/11 0523) Pulse Rate: 92 (04/11 0523)  Labs: Recent Labs    07/17/18 1845 07/18/18 0623  HGB 15.1 13.1  HCT 47.2 41.0  PLT 275 219  LABPROT 20.7* 23.4*  INR 1.8* 2.1*  CREATININE 0.78  --     Estimated Creatinine Clearance: 55.7 mL/min (by C-G formula based on SCr of 0.78 mg/dL).   Medications:  Medications Prior to Admission  Medication Sig Dispense Refill Last Dose  . amitriptyline (ELAVIL) 25 MG tablet Take 25 mg by mouth at bedtime.    unknown  . aspirin EC 81 MG tablet Take 81 mg by mouth daily.   unknown  . atorvastatin (LIPITOR) 80 MG tablet Take 80 mg by mouth daily.   unknown  . ergocalciferol (VITAMIN D2) 1.25 MG (50000 UT) capsule Take 50,000 Units by mouth once a week.   unknown  . ferrous sulfate 325 (65 FE) MG tablet Take 325 mg by mouth daily with breakfast.   unknown  . glipiZIDE (GLUCOTROL XL) 5 MG 24 hr tablet Take 5 mg by mouth daily.   unknown  . losartan (COZAAR) 50 MG tablet Take 50 mg by mouth daily.   unknown  . metFORMIN (GLUCOPHAGE) 500 MG tablet Take 2 tablets (1,000 mg total) by mouth 2 (two) times daily with a meal.   unknown  . nicotine (NICODERM CQ - DOSED IN MG/24 HOURS) 21 mg/24hr patch Place 21 mg onto the skin daily.   unknown  . nitroGLYCERIN (NITROSTAT) 0.4 MG SL tablet Place 1 tablet (0.4 mg total) under the tongue every 5 (five) minutes as needed for chest pain (up to 3 doses). 25 tablet 4 unknown  . omeprazole (PRILOSEC) 20 MG capsule Take 20 mg by mouth 2 (two) times daily before a meal.    unknow  . pregabalin (LYRICA) 150 MG capsule Take 100 mg by mouth 3 (three) times daily.    unknown  . QUEtiapine (SEROQUEL)  25 MG tablet Take 25 mg by mouth 2 (two) times daily.   unknown  . vitamin C (ASCORBIC ACID) 500 MG tablet Take 500 mg by mouth daily.   unknown  . warfarin (COUMADIN) 3 MG tablet Take 3 mg by mouth at bedtime. MANAGED BY TAPPER   unknown   Scheduled:  . amitriptyline  25 mg Oral QHS  . aspirin EC  81 mg Oral Daily  . atorvastatin  80 mg Oral q1800  . feeding supplement (ENSURE ENLIVE)  237 mL Oral BID BM  . ferrous sulfate  325 mg Oral Q breakfast  . insulin aspart  0-15 Units Subcutaneous TID WC  . insulin aspart  0-5 Units Subcutaneous QHS  . losartan  50 mg Oral Daily  . nicotine  21 mg Transdermal Daily  . pantoprazole  40 mg Oral Daily  . pregabalin  100 mg Oral TID  . QUEtiapine  12.5 mg Oral BID  . vitamin C  500 mg Oral Daily  . Vitamin D (Ergocalciferol)  50,000 Units Oral Weekly  . Warfarin - Pharmacist Dosing Inpatient   Does not apply q1800   Infusions:  . sodium chloride 100 mL/hr at 07/18/18 0813   PRN: acetaminophen, haloperidol **OR** haloperidol  lactate, ondansetron **OR** ondansetron (ZOFRAN) IV, polyethylene glycol Anti-infectives (From admission, onward)   None      Assessment: 73 year old male admitted with baseline inr 1.7, on warfarin PTA with goal 2-3 for coronary artery disease or peripheral vascular disease. INR is therapeutic this AM. No bleeding noted. Home dose of warfarin is 3mg  po daily  Goal of Therapy:  INR 2-3 Monitor platelets by anticoagulation protocol: Yes   Plan:  Warfarin 3mg  po x 1 today INR daily Monitor for signs and symptoms of bleeding   Elder CyphersLorie Kerria Sapien, BS Loura BackPharm D, BCPS Clinical Pharmacist Pager [phone] 07/18/2018,8:49 AM

## 2018-07-19 DIAGNOSIS — R159 Full incontinence of feces: Secondary | ICD-10-CM | POA: Diagnosis present

## 2018-07-19 DIAGNOSIS — M1990 Unspecified osteoarthritis, unspecified site: Secondary | ICD-10-CM | POA: Diagnosis present

## 2018-07-19 DIAGNOSIS — R32 Unspecified urinary incontinence: Secondary | ICD-10-CM | POA: Diagnosis present

## 2018-07-19 DIAGNOSIS — F17210 Nicotine dependence, cigarettes, uncomplicated: Secondary | ICD-10-CM | POA: Diagnosis present

## 2018-07-19 DIAGNOSIS — Z89611 Acquired absence of right leg above knee: Secondary | ICD-10-CM | POA: Diagnosis not present

## 2018-07-19 DIAGNOSIS — R4182 Altered mental status, unspecified: Secondary | ICD-10-CM | POA: Diagnosis present

## 2018-07-19 DIAGNOSIS — G9340 Encephalopathy, unspecified: Secondary | ICD-10-CM | POA: Diagnosis present

## 2018-07-19 DIAGNOSIS — Z7901 Long term (current) use of anticoagulants: Secondary | ICD-10-CM | POA: Diagnosis not present

## 2018-07-19 DIAGNOSIS — R627 Adult failure to thrive: Secondary | ICD-10-CM | POA: Diagnosis present

## 2018-07-19 DIAGNOSIS — E1165 Type 2 diabetes mellitus with hyperglycemia: Secondary | ICD-10-CM | POA: Diagnosis present

## 2018-07-19 DIAGNOSIS — I2510 Atherosclerotic heart disease of native coronary artery without angina pectoris: Secondary | ICD-10-CM | POA: Diagnosis present

## 2018-07-19 DIAGNOSIS — E782 Mixed hyperlipidemia: Secondary | ICD-10-CM | POA: Diagnosis present

## 2018-07-19 DIAGNOSIS — I10 Essential (primary) hypertension: Secondary | ICD-10-CM | POA: Diagnosis present

## 2018-07-19 DIAGNOSIS — Z79899 Other long term (current) drug therapy: Secondary | ICD-10-CM | POA: Diagnosis not present

## 2018-07-19 DIAGNOSIS — Z7984 Long term (current) use of oral hypoglycemic drugs: Secondary | ICD-10-CM | POA: Diagnosis not present

## 2018-07-19 DIAGNOSIS — E1140 Type 2 diabetes mellitus with diabetic neuropathy, unspecified: Secondary | ICD-10-CM | POA: Diagnosis present

## 2018-07-19 DIAGNOSIS — Z9111 Patient's noncompliance with dietary regimen: Secondary | ICD-10-CM | POA: Diagnosis not present

## 2018-07-19 DIAGNOSIS — K219 Gastro-esophageal reflux disease without esophagitis: Secondary | ICD-10-CM | POA: Diagnosis present

## 2018-07-19 DIAGNOSIS — E1151 Type 2 diabetes mellitus with diabetic peripheral angiopathy without gangrene: Secondary | ICD-10-CM | POA: Diagnosis present

## 2018-07-19 DIAGNOSIS — E43 Unspecified severe protein-calorie malnutrition: Secondary | ICD-10-CM | POA: Diagnosis present

## 2018-07-19 DIAGNOSIS — Z681 Body mass index (BMI) 19 or less, adult: Secondary | ICD-10-CM | POA: Diagnosis not present

## 2018-07-19 DIAGNOSIS — Z7982 Long term (current) use of aspirin: Secondary | ICD-10-CM | POA: Diagnosis not present

## 2018-07-19 DIAGNOSIS — Z8673 Personal history of transient ischemic attack (TIA), and cerebral infarction without residual deficits: Secondary | ICD-10-CM | POA: Diagnosis not present

## 2018-07-19 DIAGNOSIS — R4189 Other symptoms and signs involving cognitive functions and awareness: Secondary | ICD-10-CM | POA: Diagnosis present

## 2018-07-19 LAB — PROTIME-INR
INR: 3.2 — ABNORMAL HIGH (ref 0.8–1.2)
Prothrombin Time: 32.1 seconds — ABNORMAL HIGH (ref 11.4–15.2)

## 2018-07-19 LAB — GLUCOSE, CAPILLARY
Glucose-Capillary: 182 mg/dL — ABNORMAL HIGH (ref 70–99)
Glucose-Capillary: 263 mg/dL — ABNORMAL HIGH (ref 70–99)
Glucose-Capillary: 231 mg/dL — ABNORMAL HIGH (ref 70–99)
Glucose-Capillary: 234 mg/dL — ABNORMAL HIGH (ref 70–99)

## 2018-07-19 LAB — BASIC METABOLIC PANEL
Anion gap: 10 (ref 5–15)
BUN: 10 mg/dL (ref 8–23)
CO2: 19 mmol/L — ABNORMAL LOW (ref 22–32)
Calcium: 8.1 mg/dL — ABNORMAL LOW (ref 8.9–10.3)
Chloride: 109 mmol/L (ref 98–111)
Creatinine, Ser: 0.64 mg/dL (ref 0.61–1.24)
GFR calc Af Amer: >60 mL/min (ref >60)
GFR calc non Af Amer: >60 mL/min (ref >60)
Glucose, Bld: 172 mg/dL — ABNORMAL HIGH (ref 70–99)
Potassium: 3.8 mmol/L (ref 3.5–5.1)
Sodium: 138 mmol/L (ref 135–145)

## 2018-07-19 LAB — MAGNESIUM: Magnesium: 1.3 mg/dL — ABNORMAL LOW (ref 1.7–2.4)

## 2018-07-19 LAB — RPR: RPR Ser Ql: NONREACTIVE

## 2018-07-19 MED ORDER — MAGNESIUM SULFATE 2 GM/50ML IV SOLN
2.0000 g | Freq: Once | INTRAVENOUS | Status: AC
  Administered 2018-07-19: 13:00:00 2 g via INTRAVENOUS
  Filled 2018-07-19: qty 50

## 2018-07-19 MED ORDER — LOSARTAN POTASSIUM 50 MG PO TABS
100.0000 mg | ORAL_TABLET | Freq: Every day | ORAL | Status: DC
  Administered 2018-07-19 – 2018-07-20 (×2): 100 mg via ORAL
  Filled 2018-07-19 (×2): qty 2

## 2018-07-19 NOTE — Progress Notes (Signed)
ANTICOAGULATION CONSULT NOTE - Follow Up Consult  Pharmacy Consult for warfarin Indication: VTE  No Known Allergies  Patient Measurements: Height: 5\' 6"  (167.6 cm) Weight: 107 lb 2.3 oz (48.6 kg) IBW/kg (Calculated) : 63.8   Vital Signs: Temp: 98.1 F (36.7 C) (04/12 0513) Temp Source: Oral (04/12 0513) BP: 176/82 (04/12 0602) Pulse Rate: 68 (04/12 0513)  Labs: Recent Labs    07/17/18 1845 07/18/18 0623 07/18/18 1008 07/19/18 0624  HGB 15.1 13.1  --   --   HCT 47.2 41.0  --   --   PLT 275 219  --   --   LABPROT 20.7* 23.4*  --  32.1*  INR 1.8* 2.1*  --  3.2*  CREATININE 0.78  --  0.69 0.64    Estimated Creatinine Clearance: 55.7 mL/min (by C-G formula based on SCr of 0.64 mg/dL).   Medications:  Medications Prior to Admission  Medication Sig Dispense Refill Last Dose  . amitriptyline (ELAVIL) 25 MG tablet Take 25 mg by mouth at bedtime.    unknown  . aspirin EC 81 MG tablet Take 81 mg by mouth daily.   unknown  . atorvastatin (LIPITOR) 80 MG tablet Take 80 mg by mouth daily.   unknown  . ergocalciferol (VITAMIN D2) 1.25 MG (50000 UT) capsule Take 50,000 Units by mouth once a week.   unknown  . ferrous sulfate 325 (65 FE) MG tablet Take 325 mg by mouth daily with breakfast.   unknown  . glipiZIDE (GLUCOTROL XL) 5 MG 24 hr tablet Take 5 mg by mouth daily.   unknown  . losartan (COZAAR) 50 MG tablet Take 50 mg by mouth daily.   unknown  . metFORMIN (GLUCOPHAGE) 500 MG tablet Take 2 tablets (1,000 mg total) by mouth 2 (two) times daily with a meal.   unknown  . nicotine (NICODERM CQ - DOSED IN MG/24 HOURS) 21 mg/24hr patch Place 21 mg onto the skin daily.   unknown  . nitroGLYCERIN (NITROSTAT) 0.4 MG SL tablet Place 1 tablet (0.4 mg total) under the tongue every 5 (five) minutes as needed for chest pain (up to 3 doses). 25 tablet 4 unknown  . omeprazole (PRILOSEC) 20 MG capsule Take 20 mg by mouth 2 (two) times daily before a meal.    unknow  . pregabalin (LYRICA) 150  MG capsule Take 100 mg by mouth 3 (three) times daily.    unknown  . QUEtiapine (SEROQUEL) 25 MG tablet Take 25 mg by mouth 2 (two) times daily.   unknown  . vitamin C (ASCORBIC ACID) 500 MG tablet Take 500 mg by mouth daily.   unknown  . warfarin (COUMADIN) 3 MG tablet Take 3 mg by mouth at bedtime. MANAGED BY TAPPER   unknown   Scheduled:  . amitriptyline  25 mg Oral QHS  . aspirin EC  81 mg Oral Daily  . atorvastatin  80 mg Oral q1800  . feeding supplement (ENSURE ENLIVE)  237 mL Oral BID BM  . insulin aspart  0-9 Units Subcutaneous TID WC  . losartan  100 mg Oral Daily  . nicotine  21 mg Transdermal Daily  . OLANZapine zydis  5 mg Oral BID  . pregabalin  150 mg Oral BID  . Vitamin D (Ergocalciferol)  50,000 Units Oral Weekly  . Warfarin - Pharmacist Dosing Inpatient   Does not apply q1800   Infusions:  . 0.9 % NaCl with KCl 20 mEq / L 75 mL/hr at 07/18/18 2215   PRN:  acetaminophen, haloperidol **OR** haloperidol lactate, hydrALAZINE, ondansetron **OR** ondansetron (ZOFRAN) IV Anti-infectives (From admission, onward)   None      Assessment: 73 year old male admitted with baseline inr 1.7, on warfarin PTA with goal 2-3 for coronary artery disease or peripheral vascular disease. INR is supratherapeutic this AM (2.1>>3.2). No bleeding noted. Pt is FTT and AMS, likely inconsistent with home medication regimen and altered effect with poor intake. Home dose of warfarin is 3mg  po daily  Goal of Therapy:  INR 2-3 Monitor platelets by anticoagulation protocol: Yes   Plan:  No Coumadin today INR daily Monitor for signs and symptoms of bleeding   Elder Cyphers, BS Loura Back, BCPS Clinical Pharmacist Pager [phone] 07/19/2018,8:13 AM

## 2018-07-19 NOTE — Progress Notes (Signed)
PROGRESS NOTE  Marvin Nelson ZOX:[id number] DOB: 11-14-1945 DOA: 07/17/2018 PCP: Louie Boston., MD  Brief History:  73 year old male with a history of stroke, peripheral vascular disease status post right BKA on chronic anticoagulation with warfarin, diabetes mellitus, hypertension, hyperlipidemia, coronary artery disease presenting with agitation and altered mental status.  The patient had a recent hospitalization from 06/20/2018 through 06/25/2018 at [facility] for aphasia and altered mental status.  The patient underwent extensive work-up including 2 separate MRIs of the brain on 06/20/2018 a.m. 06/24/2018 which did not reveal any acute findings.  The patient also had a CT angiogram of the head and neck which revealed a normal vascular study.  In addition, he underwent video EEG monitoring over 48 hours which did not show any focal abnormalities nor any epileptiform discharges.  It showed moderate generalized slowing.  Echocardiogram with a bubble study was obtained and did not show any cardiac etiology for possible stroke.  EF was 55-60% with grade 1 diastolic dysfunction and without any WMA.  Clinically, it was determined that the patient likely had a TIA versus a small stroke by clinical assessment that was not visible on MRI.  It was felt that the underlying etiology of the TIA/stroke was due to small vessel disease.  Aspirin 81 mg was added to the patient's home warfarin.  He was noted to have LDL of 136, and atorvastatin 80 mg was started during that hospitalization.His hemoglobin A1c was noted to be 8.5 during that hospital admission.During that hospitalization, the patient was treated for iron deficiency anemia when he was noted to have iron saturation 5% with serum iron 17.  He received 5 days of Ferrlecit.  Ultimately, the patient was started on Seroquel 25 mg twice daily for his agitation.  He was subsequently discharged to [facility] health inpatient rehabilitation where  he was ultimately discharged on 07/01/2018.  The patient subsequently followed up with his primary care provider on 07/14/2018.  According to the office note, the patient was at his baseline at that time.   Unfortunately, on the evening of 07/14/2018, the patient woke up in the night incontinent of stool.  The patient was sent to the emergency department at [facility] and d/ced home in stable condition.  Since that period of time, the patient has had essentially no oral intake.  In addition, he has not taken any home medications for the same period of time.  As a result, the patient continued to remain combative.  As result, EMS was activated on 07/17/2018.  Apparently, the patient was so agitated that the assistance of police was required to bring patient to the emergency department. In the emergency department, the patient was afebrile hemodynamically stable saturating 100% on room air.  BMP, LFTs, and CBC were essentially unremarkable.  CT of the brain was negative and chest x-ray was unremarkable.  Assessment/Plan: Acute Encephalopathy, type unspecified -pt has had extensive work up as discussed above -serum B12--222 -check RPR--neg -check ammonia--24 -UA--neg for pyruia -UDS--neg -06/21/18 folic acid--16.9 -check thiamine--results pending -07/17/18 CT brain negative -pt likely has progression of his underlying cognitive impairment exacerbated by recent FTT and refusal to eat and take medications -Patient more alert and less agitated 4/12, but continues to have very poor po intake putting him at risk for dehydration and readmission  Diabetes mellitus type 2, uncontrolled with hyperglycemia -06/20/2018 hemoglobin A1c 8.4 -NovoLog sliding scale  Essential hypertension -Initially refused to take po meds -  now resume losartan  Failure to thrive in adult -PT evaluation -Judicious IV fluids -Work-up as above -TSH 1.462 -He continues to  very poor po intake putting him at risk for dehydration and  readmission -Continue Ensure  Peripheral vascular disease -Status post right AKA -Patient has been on warfarin for over 10 years which will be continued  Tobacco abuse -Continue NicoDerm patch -Patient has not smoked since discharge from his recent hospitalization in March 2020 -He has been wearing the NicoDerm patch at home  Hyperlipidemia -Continue statin if the patient has able and willing to take  Recent TIA/stroke -As discussed above during his hospitalization in March 2020 at [facility] -Continue aspirin if patient is willing and able to take  Hypomagnesemia -replete     Disposition Plan:   SNF in 1-2 days if pt agrees Family Communication:   Spouse updated on phone 4/12  Consultants:  none  Code Status:  FULL   DVT Prophylaxis:  warfarin   Procedures: As Listed in Progress Note Above  Antibiotics: None      Subjective: Pt continues to have very poor po intake. Patient denies fevers, chills, headache, chest pain, dyspnea, nausea, vomiting, diarrhea, abdominal pain, dysuria, hematuria, hematochezia, and melena.   Objective: Vitals:   07/18/18 1535 07/18/18 2110 07/19/18 0513 07/19/18 0602  BP: (!) 147/71 (!) 154/83 (!) 171/88 (!) 176/82  Pulse: 80 74 68   Resp: 16 16 16    Temp: (!) 97.5 F (36.4 C) 98 F (36.7 C) 98.1 F (36.7 C)   TempSrc: Oral Oral Oral   SpO2: 99% 99% 96%   Weight:      Height:        Intake/Output Summary (Last 24 hours) at 07/19/2018 1140 Last data filed at 07/19/2018 0857 Gross per 24 hour  Intake 260 ml  Output 550 ml  Net -290 ml   Weight change:  Exam:   General:  Pt is alert, follows commands appropriately, not in acute distress  HEENT: No icterus, No thrush, No neck mass, /AT  Cardiovascular: RRR, S1/S2, no rubs, no gallops  Respiratory: diminished breath sounds, butCTA bilaterally, no wheezing, no crackles, no rhonchi  Abdomen: Soft/+BS, non tender, non distended,  no guarding  Extremities: No edema, No lymphangitis, No petechiae, No rashes, no synovitis; Right AKA   Data Reviewed: I have personally reviewed following labs and imaging studies Basic Metabolic Panel: Recent Labs  Lab 07/17/18 1845 07/18/18 1008 07/19/18 0624  NA 138 138 138  K 3.6 3.4* 3.8  CL 101 106 109  CO2 22 19* 19*  GLUCOSE 205* 188* 172*  BUN 18 15 10   CREATININE 0.78 0.69 0.64  CALCIUM 8.9 8.0* 8.1*  MG  --   --  1.3*   Liver Function Tests: Recent Labs  Lab 07/17/18 1845  AST 20  ALT 24  ALKPHOS 103  BILITOT 1.3*  PROT 7.7  ALBUMIN 4.2   No results for input(s): LIPASE, AMYLASE in the last 168 hours. Recent Labs  Lab 07/18/18 1008  AMMONIA 24   Coagulation Profile: Recent Labs  Lab 07/17/18 1845 07/18/18 0623 07/19/18 0624  INR 1.8* 2.1* 3.2*   CBC: Recent Labs  Lab 07/17/18 1845 07/18/18 0623  WBC 7.4 7.4  NEUTROABS 5.3  --   HGB 15.1 13.1  HCT 47.2 41.0  MCV 85.7 84.5  PLT 275 219   Cardiac Enzymes: No results for input(s): CKTOTAL, CKMB, CKMBINDEX, TROPONINI in the last 168 hours. BNP: Invalid input(s): POCBNP  CBG: Recent Labs  Lab 07/18/18 1109 07/18/18 1612 07/18/18 2107 07/19/18 0727 07/19/18 1116  GLUCAP 167* 103* 168* 182* 263*   HbA1C: No results for input(s): HGBA1C in the last 72 hours. Urine analysis:    Component Value Date/Time   COLORURINE YELLOW 07/17/2018 1825   APPEARANCEUR CLEAR 07/17/2018 1825   LABSPEC 1.015 07/17/2018 1825   PHURINE 5.0 07/17/2018 1825   GLUCOSEU >=500 (A) 07/17/2018 1825   HGBUR NEGATIVE 07/17/2018 1825   BILIRUBINUR NEGATIVE 07/17/2018 1825   KETONESUR 80 (A) 07/17/2018 1825   PROTEINUR NEGATIVE 07/17/2018 1825   NITRITE NEGATIVE 07/17/2018 1825   LEUKOCYTESUR NEGATIVE 07/17/2018 1825   Sepsis Labs: @LABRCNTIP (procalcitonin:4,lacticidven:4) )No results found for this or any previous visit (from the past 240 hour(s)).   Scheduled Meds:  amitriptyline  25 mg Oral QHS     aspirin EC  81 mg Oral Daily   atorvastatin  80 mg Oral q1800   feeding supplement (ENSURE ENLIVE)  237 mL Oral BID BM   insulin aspart  0-9 Units Subcutaneous TID WC   losartan  100 mg Oral Daily   nicotine  21 mg Transdermal Daily   OLANZapine zydis  5 mg Oral BID   pregabalin  150 mg Oral BID   Vitamin D (Ergocalciferol)  50,000 Units Oral Weekly   Warfarin - Pharmacist Dosing Inpatient   Does not apply q1800   Continuous Infusions:  Procedures/Studies: Ct Head Wo Contrast  Result Date: 07/17/2018 CLINICAL DATA:  Altered mental status. EXAM: CT HEAD WITHOUT CONTRAST TECHNIQUE: Contiguous axial images were obtained from the base of the skull through the vertex without intravenous contrast. COMPARISON:  Head CT dated 07/15/2018. Brain MRI dated 07/15/2018. FINDINGS: Brain: Generalized age related parenchymal volume loss with commensurate dilatation of the ventricles and sulci. Chronic small vessel ischemic changes again noted within the bilateral periventricular and subcortical white matter regions. No mass, hemorrhage, edema or other evidence of acute parenchymal abnormality. No extra-axial hemorrhage.Lower portions of the brain are difficult to definitively characterize due to patient motion artifact. Vascular: Chronic calcified atherosclerotic changes of the large vessels at the skull base. No unexpected hyperdense vessel. Skull: Normal. Negative for fracture or focal lesion. Sinuses/Orbits: No acute finding, characterization limited by patient motion artifact. Other: None. IMPRESSION: 1. No acute findings. No intracranial mass, hemorrhage or edema. Mild study limitations detailed above. 2. Atrophy and chronic ischemic changes in the white matter. Electronically Signed   By: Bary RichardStan  Maynard M.D.   On: 07/17/2018 18:53   Dg Chest Port 1 View  Result Date: 07/17/2018 CLINICAL DATA:  Altered mental status EXAM: PORTABLE CHEST 1 VIEW COMPARISON:  07/15/2018 FINDINGS: The heart size and  mediastinal contours are within normal limits. Both lungs are clear. The visualized skeletal structures are unremarkable. IMPRESSION: No active disease. Electronically Signed   By: Deatra RobinsonKevin  Herman M.D.   On: 07/17/2018 18:37    Catarina Hartshornavid Aristea Posada, DO  [facility] Pager [phone]  If 7PM-7AM, please contact night-coverage [email or web address] Password [email or web address] 07/19/2018, 11:40 AM   LOS: 0 days

## 2018-07-20 DIAGNOSIS — E43 Unspecified severe protein-calorie malnutrition: Secondary | ICD-10-CM

## 2018-07-20 LAB — BASIC METABOLIC PANEL
Anion gap: 7 (ref 5–15)
BUN: 8 mg/dL (ref 8–23)
CO2: 23 mmol/L (ref 22–32)
Calcium: 8.3 mg/dL — ABNORMAL LOW (ref 8.9–10.3)
Chloride: 105 mmol/L (ref 98–111)
Creatinine, Ser: 0.68 mg/dL (ref 0.61–1.24)
GFR calc Af Amer: >60 mL/min (ref >60)
GFR calc non Af Amer: >60 mL/min (ref >60)
Glucose, Bld: 283 mg/dL — ABNORMAL HIGH (ref 70–99)
Potassium: 3.5 mmol/L (ref 3.5–5.1)
Sodium: 135 mmol/L (ref 135–145)

## 2018-07-20 LAB — MAGNESIUM: Magnesium: 1.7 mg/dL (ref 1.7–2.4)

## 2018-07-20 LAB — PROTIME-INR
INR: 2.3 — ABNORMAL HIGH (ref 0.8–1.2)
Prothrombin Time: 24.9 seconds — ABNORMAL HIGH (ref 11.4–15.2)

## 2018-07-20 LAB — GLUCOSE, CAPILLARY
Glucose-Capillary: 254 mg/dL — ABNORMAL HIGH (ref 70–99)
Glucose-Capillary: 303 mg/dL — ABNORMAL HIGH (ref 70–99)

## 2018-07-20 LAB — CBC
HCT: 39 % (ref 39.0–52.0)
Hemoglobin: 12.9 g/dL — ABNORMAL LOW (ref 13.0–17.0)
MCH: 27.7 pg (ref 26.0–34.0)
MCHC: 33.1 g/dL (ref 30.0–36.0)
MCV: 83.7 fL (ref 80.0–100.0)
Platelets: 183 10*3/uL (ref 150–400)
RBC: 4.66 MIL/uL (ref 4.22–5.81)
RDW: 18.9 % — ABNORMAL HIGH (ref 11.5–15.5)
WBC: 8.6 10*3/uL (ref 4.0–10.5)
nRBC: 0 % (ref 0.0–0.2)

## 2018-07-20 LAB — VITAMIN B1: Vitamin B1 (Thiamine): 104.6 nmol/L (ref 66.5–200.0)

## 2018-07-20 MED ORDER — OLANZAPINE 5 MG PO TBDP: 5.0000 mg | ORAL_TABLET | Freq: Two times a day (BID) | ORAL | 0 refills | Status: AC

## 2018-07-20 MED ORDER — MAGNESIUM SULFATE 2 GM/50ML IV SOLN
2.0000 g | Freq: Once | INTRAVENOUS | Status: AC
  Administered 2018-07-20: 10:00:00 2 g via INTRAVENOUS
  Filled 2018-07-20: qty 50

## 2018-07-20 MED ORDER — AMLODIPINE BESYLATE 2.5 MG PO TABS: 2.5000 mg | ORAL_TABLET | Freq: Every day | ORAL | 1 refills | Status: AC

## 2018-07-20 MED ORDER — ADULT MULTIVITAMIN W/MINERALS CH: 1.0000 | ORAL_TABLET | Freq: Every day | ORAL | Status: AC

## 2018-07-20 MED ORDER — INSULIN ASPART 100 UNIT/ML ~~LOC~~ SOLN: 0.0000 [IU] | Freq: Every day | SUBCUTANEOUS | Status: DC

## 2018-07-20 MED ORDER — ONDANSETRON HCL 4 MG/2ML IJ SOLN: 4.0000 mg | Freq: Four times a day (QID) | INTRAMUSCULAR | 0 refills | Status: DC | PRN

## 2018-07-20 MED ORDER — ENSURE ENLIVE PO LIQD: 237.0000 mL | Freq: Two times a day (BID) | ORAL | 12 refills | Status: AC

## 2018-07-20 MED ORDER — NICOTINE 14 MG/24HR TD PT24: 14.0000 mg | MEDICATED_PATCH | Freq: Every day | TRANSDERMAL | Status: DC

## 2018-07-20 MED ORDER — LOSARTAN POTASSIUM 100 MG PO TABS: 100.0000 mg | ORAL_TABLET | Freq: Every day | ORAL | 1 refills | Status: AC

## 2018-07-20 MED ORDER — PREGABALIN 150 MG PO CAPS: 150.0000 mg | ORAL_CAPSULE | Freq: Two times a day (BID) | ORAL | 0 refills | Status: AC

## 2018-07-20 MED ORDER — MAGNESIUM OXIDE 400 (241.3 MG) MG PO TABS: 400.0000 mg | ORAL_TABLET | Freq: Every day | ORAL | 0 refills | Status: AC

## 2018-07-20 MED ORDER — NICOTINE 14 MG/24HR TD PT24: 14.0000 mg | MEDICATED_PATCH | Freq: Every day | TRANSDERMAL | 0 refills | Status: AC

## 2018-07-20 MED ORDER — WARFARIN SODIUM 2 MG PO TABS: 3.0000 mg | ORAL_TABLET | Freq: Once | ORAL | Status: DC

## 2018-07-20 MED ORDER — MAGNESIUM OXIDE 400 (241.3 MG) MG PO TABS: 400.0000 mg | ORAL_TABLET | Freq: Every day | ORAL | Status: DC

## 2018-07-20 MED ORDER — ADULT MULTIVITAMIN W/MINERALS CH
1.0000 | ORAL_TABLET | Freq: Every day | ORAL | Status: DC
  Administered 2018-07-20: 13:00:00 1 via ORAL
  Filled 2018-07-20: qty 1

## 2018-07-20 MED ORDER — AMLODIPINE BESYLATE 5 MG PO TABS
2.5000 mg | ORAL_TABLET | Freq: Every day | ORAL | Status: DC
  Administered 2018-07-20: 2.5 mg via ORAL
  Filled 2018-07-20: qty 1

## 2018-07-20 MED ORDER — INSULIN ASPART 100 UNIT/ML ~~LOC~~ SOLN
0.0000 [IU] | Freq: Three times a day (TID) | SUBCUTANEOUS | Status: DC
  Administered 2018-07-20: 11 [IU] via SUBCUTANEOUS

## 2018-07-20 NOTE — Progress Notes (Signed)
Initial Nutrition Assessment  DOCUMENTATION CODES:   Severe malnutrition in context of acute illness/injury, Underweight  INTERVENTION:  Ensure Enlive po BID, each supplement provides 350 kcal and 20 grams of protein   Recommend liberalize: Heart Healthy/CHO modified diet- due to poor oral intake  Provided High Calorie / High Protein nutrition therapy handout.   NUTRITION DIAGNOSIS:   Severe Malnutrition related to acute illness, poor appetite(altered mental status) as evidenced by per patient/family report, percent weight loss, severe fat depletion, severe muscle depletion(dehydrated on admission).   GOAL:   Patient will meet greater than or equal to 90% of their needs MONITOR:   PO intake, Supplement acceptance, Labs, Weight trends REASON FOR ASSESSMENT:   Reported poor oral intake prior to admission and history of moderate malnutrition     ASSESSMENT: Patient presents with altered mental status. Change in mental status persisting the past month. Decline in ADL's (new incontinence) per wife.  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   Meal intake: 0-20% 4/11 then dramatically increased to 100% of the next 3 meals. Patient says doesn't know what has been wrong with his appetite. He lives with wife who shops for the food and prepares the meals. Wife intake as poor with the mental status changes over the past month. Provided High Calorie / High Protein nutrition therapy handout and discussed basic recommendations with him and asked that he give it to spouse for review..   Patient is talking about going to rehab when discharged "to get built back up".   Patient is underweight- usual weight reported at 115-120 lb. Patient says 120 lb is the highest weight in recent months. Currently 107 lb which is a significant loss of 7% the past month.     Medications reviewed and include:  SSI, Lipitor, Mag-ox, Nicoderm  Labs: BMP Latest Ref Rng & Units 07/20/2018 07/19/2018  07/18/2018  Glucose 70 - 99 mg/dL 060(O) 459(X) 774(F)  BUN 8 - 23 mg/dL 8 10 15   Creatinine 0.61 - 1.24 mg/dL 4.23 9.53 2.02  Sodium 135 - 145 mmol/L 135 138 138  Potassium 3.5 - 5.1 mmol/L 3.5 3.8 3.4(L)  Chloride 98 - 111 mmol/L 105 109 106  CO2 22 - 32 mmol/L 23 19(L) 19(L)  Calcium 8.9 - 10.3 mg/dL 8.3(L) 8.1(L) 8.0(L)     NUTRITION - FOCUSED PHYSICAL EXAM: Nutrition-Focused physical exam completed. Findings are severe fat depletion, severe muscle depletion, and no edema.     Diet Order:   Diet Order            Diet heart healthy/carb modified Room service appropriate? Yes; Fluid consistency: Thin  Diet effective now              EDUCATION NEEDS: addressed  Education needs have been addressed Skin:  Skin Assessment: Reviewed RN Assessment  Last BM:  unknown  Height:   Ht Readings from Last 1 Encounters:  07/17/18 5\' 6"  (1.676 m)    Weight:   Wt Readings from Last 1 Encounters:  07/17/18 48.6 kg    Ideal Body Weight:  65 kg  BMI:  Body mass index is 17.29 kg/m.  Estimated Nutritional Needs:   Kcal:  1715-1960 (35-40 kcal/kg/bw)  Protein:  68-88 gr (1.5-1.8 gr/kg/bw)  Fluid:  1500 ml daily  Royann Shivers MS,RD,CSG,LDN Office: [phone] Pager: [phone]

## 2018-07-20 NOTE — Discharge Summary (Signed)
Physician Discharge Summary  Marvin Nelson XLK:[id number] DOB: 07/10/1945 DOA: 07/17/2018  PCP: [name], Tagen Brethauer B., MD  Admit date: 07/17/2018 Discharge date: 07/20/2018  Admitted From: Home Disposition:  Home   Recommendations for Outpatient Follow-up:  1. Follow up with PCP in 1-2 weeks 2. Please obtain BMP/CBC in one week 3. Please check INR in 24-48 hours and adjust warfarin for INR 2-3   Discharge Condition: Stable CODE STATUS: FULL Diet recommendation: Heart Healthy / Carb Modified   Brief/Interim Summary: 73 year old male with a history of stroke, peripheral vascular disease status post right BKA on chronic anticoagulation with warfarin, diabetes mellitus, hypertension, hyperlipidemia, coronary artery disease presenting with agitation and altered mental status. The patient had a recent hospitalization from 06/20/2018 through 06/25/2018 [facility] for aphasia and altered mental status. The patient underwent extensive work-up including 2 separate MRIs of the brain on 06/20/2018 a.m. 06/24/2018 which did not reveal any acute findings. The patient also had a CT angiogram of the head and neck which revealed a normal vascular study. In addition, he underwent video EEG monitoring over 48hours which did not show any focal abnormalities nor any epileptiform discharges. It showed moderate generalized slowing. Echocardiogram with a bubble study was obtained and did not show any cardiac etiology for possible stroke. EF was 55-60% with grade 1 diastolic dysfunction and without any WMA. Clinically, it was determined that the patient likely had a TIA versus a small stroke by clinical assessment that was not visible on MRI. It was felt that the underlying etiology of the TIA/stroke was due to small vessel disease. Aspirin 81 mg was added to the patient's home warfarin. He was noted to have LDL of 136, and atorvastatin 80 mg was started during that hospitalization.His hemoglobin  A1c was noted to be 8.5 during that hospital admission.During that hospitalization, the patient was treated for iron deficiency anemia when he was noted to have iron saturation 5% with serum iron 17. He received 5 days of Ferrlecit.Ultimately, the patient was started on Seroquel 25mg  twice daily for his agitation. [facility] where he was ultimately discharged on 07/01/2018. The patient subsequently followed up with his primary care provider on 07/14/2018. According to the office note, the patient was at his baseline at that time.   Unfortunately, on the evening of 07/14/2018, the patient woke up in the night incontinent of stool. The patient was [facility] and d/ced home in stable condition.Since that period of time, the patient has had essentially no oral intake.  In addition, he has not taken any home medications for the same period of time. As a result, the patient continued to remain combative. As result, EMS was activated on 07/17/2018. Apparently, the patient was so agitated that the assistance of police was required to bring patientto the emergency department. In the emergency department, the patient was afebrile hemodynamically stable saturating 100% on room air. BMP, LFTs, and CBC were essentially unremarkable. CT of the brain was negative and chest x-ray was unremarkable.   Discharge Diagnoses:  AcuteEncephalopathy, type unspecified -pt has had extensive work up as discussed above -serum B12--222 -check RPR--neg -check ammonia--24 -UA--neg for pyruia -UDS--neg -06/21/18 folic acid--16.9 -check thiamine level--104 -07/17/18 CT brain negative -pt likely has progression of his underlying cognitive impairment exacerbated by recent FTT and refusal to eat and take medications -Patient more alert and communicative and back to baseline 07/20/18  Diabetes mellitus type 2, uncontrolled  with  hyperglycemia -06/20/2018 hemoglobin A1c 8.4 -NovoLog sliding scale  Essential hypertension -Initially refused to take po meds -now resume losartan--increase dose to 100 mg -SBP 152/72--manual on 07/20/18 -add amlodipine 2.5 mg daily  Failure to thrive in adult -PT evaluation-->SNF -Judicious IV fluids -Work-up as above -TSH 1.462 -po intake gradually improving -added Ensure  Peripheral vascular disease -Status post right AKA -Patient has been on warfarin for over 10 years which will be continued -INR 2.3 on day of d/c  Tobacco abuse -Continue NicoDerm patch -Patient has not smoked since discharge from his recent hospitalization in March 2020 -He has been wearing the NicoDerm patch at home  Hyperlipidemia -Continue statin if the patient has able and willing to take  Recent TIA/stroke -As discussed above during his hospitalization in March 2020 at [facility] -Continue aspirin if patient is willing and able to take  Hypomagnesemia -repleted  Severe Malnutrition -continue Ensure   Discharge Instructions   Allergies as of 07/20/2018   No Known Allergies     Medication List    STOP taking these medications   nicotine 21 mg/24hr patch Commonly known as:  NICODERM CQ - dosed in mg/24 hours Replaced by:  nicotine 14 mg/24hr patch   QUEtiapine 25 MG tablet Commonly known as:  SEROQUEL     TAKE these medications   amitriptyline 25 MG tablet Commonly known as:  ELAVIL Take 25 mg by mouth at bedtime.   amLODipine 2.5 MG tablet Commonly known as:  NORVASC Take 1 tablet (2.5 mg total) by mouth daily.   aspirin EC 81 MG tablet Take 81 mg by mouth daily.   atorvastatin 80 MG tablet Commonly known as:  LIPITOR Take 80 mg by mouth daily.   ergocalciferol 1.25 MG (50000 UT) capsule Commonly known as:  VITAMIN D2 Take 50,000 Units by mouth once a week.   feeding supplement (ENSURE ENLIVE) Liqd Take 237 mLs by mouth 2 (two) times  daily between meals.   ferrous sulfate 325 (65 FE) MG tablet Take 325 mg by mouth daily with breakfast.   glipiZIDE 5 MG 24 hr tablet Commonly known as:  GLUCOTROL XL Take 5 mg by mouth daily.   losartan 100 MG tablet Commonly known as:  COZAAR Take 1 tablet (100 mg total) by mouth daily. Start taking on:  July 21, 2018 What changed:    medication strength  how much to take   magnesium oxide 400 (241.3 Mg) MG tablet Commonly known as:  MAG-OX Take 1 tablet (400 mg total) by mouth daily. Start taking on:  July 21, 2018   metFORMIN 500 MG tablet Commonly known as:  GLUCOPHAGE Take 2 tablets (1,000 mg total) by mouth 2 (two) times daily with a meal.   multivitamin with minerals Tabs tablet Take 1 tablet by mouth daily.   nicotine 14 mg/24hr patch Commonly known as:  NICODERM CQ - dosed in mg/24 hours Place 1 patch (14 mg total) onto the skin daily. Start taking on:  July 21, 2018 Replaces:  nicotine 21 mg/24hr patch   nitroGLYCERIN 0.4 MG SL tablet Commonly known as:  NITROSTAT Place 1 tablet (0.4 mg total) under the tongue every 5 (five) minutes as needed for chest pain (up to 3 doses).   OLANZapine zydis 5 MG disintegrating tablet Commonly known as:  ZYPREXA Take 1 tablet (5 mg total) by mouth 2 (two) times daily.   omeprazole 20 MG capsule Commonly known as:  PRILOSEC Take 20 mg by mouth 2 (  two) times daily before a meal.   pregabalin 150 MG capsule Commonly known as:  LYRICA Take 1 capsule (150 mg total) by mouth 2 (two) times daily. What changed:    how much to take  when to take this   vitamin C 500 MG tablet Commonly known as:  ASCORBIC ACID Take 500 mg by mouth daily.   warfarin 3 MG tablet Commonly known as:  COUMADIN Take 3 mg by mouth at bedtime. MANAGED BY TAPPER       No Known Allergies  Consultations:  none   Procedures/Studies: Ct Head Wo Contrast  Result Date: 07/17/2018 CLINICAL DATA:  Altered mental status. EXAM: CT HEAD  WITHOUT CONTRAST TECHNIQUE: Contiguous axial images were obtained from the base of the skull through the vertex without intravenous contrast. COMPARISON:  Head CT dated 07/15/2018. Brain MRI dated 07/15/2018. FINDINGS: Brain: Generalized age related parenchymal volume loss with commensurate dilatation of the ventricles and sulci. Chronic small vessel ischemic changes again noted within the bilateral periventricular and subcortical white matter regions. No mass, hemorrhage, edema or other evidence of acute parenchymal abnormality. No extra-axial hemorrhage.Lower portions of the brain are difficult to definitively characterize due to patient motion artifact. Vascular: Chronic calcified atherosclerotic changes of the large vessels at the skull base. No unexpected hyperdense vessel. Skull: Normal. Negative for fracture or focal lesion. Sinuses/Orbits: No acute finding, characterization limited by patient motion artifact. Other: None. IMPRESSION: 1. No acute findings. No intracranial mass, hemorrhage or edema. Mild study limitations detailed above. 2. Atrophy and chronic ischemic changes in the white matter. Electronically Signed   By: Bary Richard M.D.   On: 07/17/2018 18:53   Dg Chest Port 1 View  Result Date: 07/17/2018 CLINICAL DATA:  Altered mental status EXAM: PORTABLE CHEST 1 VIEW COMPARISON:  07/15/2018 FINDINGS: The heart size and mediastinal contours are within normal limits. Both lungs are clear. The visualized skeletal structures are unremarkable. IMPRESSION: No active disease. Electronically Signed   By: Deatra Robinson M.D.   On: 07/17/2018 18:37        Discharge Exam: Vitals:   07/19/18 2100 07/20/18 0657  BP: (!) 154/84 (!) 171/77  Pulse: 74 68  Resp: 18 18  Temp: 98 F (36.7 C) 97.9 F (36.6 C)  SpO2: 99% 97%   Vitals:   07/19/18 0602 07/19/18 1321 07/19/18 2100 07/20/18 0657  BP: (!) 176/82 (!) 155/68 (!) 154/84 (!) 171/77  Pulse:  87 74 68  Resp:  Temp:  97.9 F  (36.6 C) 98 F (36.7 C) 97.9 F (36.6 C)  TempSrc:  Other (Comment) Oral   SpO2:  97% 99% 97%  Weight:      Height:        General: Pt is alert, awake, not in acute distress Cardiovascular: RRR, S1/S2 +, no rubs, no gallops Respiratory: diminished breath sounds but CTA bilaterally, no wheezing, no rhonchi Abdominal: Soft, NT, ND, bowel sounds + Extremities: no edema, no cyanosis   The results of significant diagnostics from this hospitalization (including imaging, microbiology, ancillary and laboratory) are listed below for reference.    Significant Diagnostic Studies: Ct Head Wo Contrast  Result Date: 07/17/2018 CLINICAL DATA:  Altered mental status. EXAM: CT HEAD WITHOUT CONTRAST TECHNIQUE: Contiguous axial images were obtained from the base of the skull through the vertex without intravenous contrast. COMPARISON:  Head CT dated 07/15/2018. Brain MRI dated 07/15/2018. FINDINGS: Brain: Generalized age related parenchymal volume loss with commensurate dilatation of the ventricles and  sulci. Chronic small vessel ischemic changes again noted within the bilateral periventricular and subcortical white matter regions. No mass, hemorrhage, edema or other evidence of acute parenchymal abnormality. No extra-axial hemorrhage.Lower portions of the brain are difficult to definitively characterize due to patient motion artifact. Vascular: Chronic calcified atherosclerotic changes of the large vessels at the skull base. No unexpected hyperdense vessel. Skull: Normal. Negative for fracture or focal lesion. Sinuses/Orbits: No acute finding, characterization limited by patient motion artifact. Other: None. IMPRESSION: 1. No acute findings. No intracranial mass, hemorrhage or edema. Mild study limitations detailed above. 2. Atrophy and chronic ischemic changes in the white matter. Electronically Signed   By: Bary Richard M.D.   On: 07/17/2018 18:53   Dg Chest Port 1 View  Result Date: 07/17/2018 CLINICAL  DATA:  Altered mental status EXAM: PORTABLE CHEST 1 VIEW COMPARISON:  07/15/2018 FINDINGS: The heart size and mediastinal contours are within normal limits. Both lungs are clear. The visualized skeletal structures are unremarkable. IMPRESSION: No active disease. Electronically Signed   By: Deatra Robinson M.D.   On: 07/17/2018 18:37     Microbiology: No results found for this or any previous visit (from the past 240 hour(s)).   Labs: Basic Metabolic Panel: Recent Labs  Lab 07/17/18 1845 07/18/18 1008 07/19/18 0624 07/20/18 0540  NA 138 138 138 135  K 3.6 3.4* 3.8 3.5  CL 101 106 109 105  CO2 22 19* 19* 23  GLUCOSE 205* 188* 172* 283*  BUN CREATININE 0.78 0.69 0.64 0.68  CALCIUM 8.9 8.0* 8.1* 8.3*  MG  --   --  1.3* 1.7   Liver Function Tests: Recent Labs  Lab 07/17/18 1845  AST 20  ALT 24  ALKPHOS 103  BILITOT 1.3*  PROT 7.7  ALBUMIN 4.2   No results for input(s): LIPASE, AMYLASE in the last 168 hours. Recent Labs  Lab 07/18/18 1008  AMMONIA 24   CBC: Recent Labs  Lab 07/17/18 1845 07/18/18 0623 07/20/18 0540  WBC 7.4 7.4 8.6  NEUTROABS 5.3  --   --   HGB 15.1 13.1 12.9*  HCT 47.2 41.0 39.0  MCV 85.7 84.5 83.7  PLT 275 219 183   Cardiac Enzymes: No results for input(s): CKTOTAL, CKMB, CKMBINDEX, TROPONINI in the last 168 hours. BNP: Invalid input(s): POCBNP CBG: Recent Labs  Lab 07/19/18 1116 07/19/18 1614 07/19/18 2124 07/20/18 0739 07/20/18 1122  GLUCAP 263* 231* 234* 254* 303*    Time coordinating discharge:  36 minutes  Signed:  Catarina Hartshorn, DO [facility] Pager: [phone] 07/20/2018, 1:39 PM

## 2018-07-20 NOTE — Care Management Important Message (Signed)
Important Message  Patient Details  Name: Marvin Nelson MRN: [id number] Date of Birth: [birth year]   Medicare Important Message Given:  Yes    Corey Harold 07/20/2018, 11:44 AM

## 2018-07-20 NOTE — Progress Notes (Signed)
Inpatient Diabetes Program Recommendations  AACE/ADA: New Consensus Statement on Inpatient Glycemic Control   Target Ranges:  Prepandial:   less than 140 mg/dL      Peak postprandial:   less than 180 mg/dL (1-2 hours)      Critically ill patients:  140 - 180 mg/dL  Results for QUIDO, BRUINGTON (MRN [id number]) as of 07/20/2018 07:43  Ref. Range 07/19/2018 07:27 07/19/2018 11:16 07/19/2018 16:14 07/19/2018 21:24 07/20/2018 07:39  Glucose-Capillary Latest Ref Range: 70 - 99 mg/dL 852 (H) 778 (H) 242 (H) 234 (H) 254 (H)    Review of Glycemic Control  Diabetes history: DM2 Outpatient Diabetes medications: Metformin 1000 mg BID Current orders for Inpatient glycemic control: Novolog 0-9 units TID with meals  Inpatient Diabetes Program Recommendations:  Correction (SSI): Please consider increasing Novolog correcton to moderate scale (0-15 units TID) and add Novolog 0-5 units QHS for bedtime correction.  Thanks, Orlando Penner, RN, MSN, CDE Diabetes Coordinator Inpatient Diabetes Program [phone] (Team Pager from 8am to 5pm)

## 2018-07-20 NOTE — Progress Notes (Signed)
Patient took all his scheduled medications, and had several snacks during the night.  Patient had over 1300 ml output during the night.  Patient in good spirits, happy and calm.

## 2018-07-20 NOTE — Progress Notes (Signed)
ANTICOAGULATION CONSULT NOTE - Follow Up Consult  Pharmacy Consult for warfarin Indication: VTE  No Known Allergies  Patient Measurements: Height: 5\' 6"  (167.6 cm) Weight: 107 lb 2.3 oz (48.6 kg) IBW/kg (Calculated) : 63.8   Vital Signs: Temp: 97.9 F (36.6 C) (04/13 0657) Temp Source: Oral (04/12 2100) BP: 171/77 (04/13 0657) Pulse Rate: 68 (04/13 0657)  Labs: Recent Labs    07/17/18 1845 07/18/18 0623 07/18/18 1008 07/19/18 0624 07/20/18 0540  HGB 15.1 13.1  --   --  12.9*  HCT 47.2 41.0  --   --  39.0  PLT 275 219  --   --  183  LABPROT 20.7* 23.4*  --  32.1* 24.9*  INR 1.8* 2.1*  --  3.2* 2.3*  CREATININE 0.78  --  0.69 0.64 0.68    Estimated Creatinine Clearance: 55.7 mL/min (by C-G formula based on SCr of 0.68 mg/dL).   Medications:  Medications Prior to Admission  Medication Sig Dispense Refill Last Dose  . amitriptyline (ELAVIL) 25 MG tablet Take 25 mg by mouth at bedtime.    unknown  . aspirin EC 81 MG tablet Take 81 mg by mouth daily.   unknown  . atorvastatin (LIPITOR) 80 MG tablet Take 80 mg by mouth daily.   unknown  . ergocalciferol (VITAMIN D2) 1.25 MG (50000 UT) capsule Take 50,000 Units by mouth once a week.   unknown  . ferrous sulfate 325 (65 FE) MG tablet Take 325 mg by mouth daily with breakfast.   unknown  . glipiZIDE (GLUCOTROL XL) 5 MG 24 hr tablet Take 5 mg by mouth daily.   unknown  . losartan (COZAAR) 50 MG tablet Take 50 mg by mouth daily.   unknown  . metFORMIN (GLUCOPHAGE) 500 MG tablet Take 2 tablets (1,000 mg total) by mouth 2 (two) times daily with a meal.   unknown  . nicotine (NICODERM CQ - DOSED IN MG/24 HOURS) 21 mg/24hr patch Place 21 mg onto the skin daily.   unknown  . nitroGLYCERIN (NITROSTAT) 0.4 MG SL tablet Place 1 tablet (0.4 mg total) under the tongue every 5 (five) minutes as needed for chest pain (up to 3 doses). 25 tablet 4 unknown  . omeprazole (PRILOSEC) 20 MG capsule Take 20 mg by mouth 2 (two) times daily before  a meal.    unknow  . pregabalin (LYRICA) 150 MG capsule Take 100 mg by mouth 3 (three) times daily.    unknown  . QUEtiapine (SEROQUEL) 25 MG tablet Take 25 mg by mouth 2 (two) times daily.   unknown  . vitamin C (ASCORBIC ACID) 500 MG tablet Take 500 mg by mouth daily.   unknown  . warfarin (COUMADIN) 3 MG tablet Take 3 mg by mouth at bedtime. MANAGED BY TAPPER   unknown   Scheduled:  . amitriptyline  25 mg Oral QHS  . aspirin EC  81 mg Oral Daily  . atorvastatin  80 mg Oral q1800  . feeding supplement (ENSURE ENLIVE)  237 mL Oral BID BM  . insulin aspart  0-9 Units Subcutaneous TID WC  . losartan  100 mg Oral Daily  . nicotine  21 mg Transdermal Daily  . OLANZapine zydis  5 mg Oral BID  . pregabalin  150 mg Oral BID  . Vitamin D (Ergocalciferol)  50,000 Units Oral Weekly  . Warfarin - Pharmacist Dosing Inpatient   Does not apply q1800   Infusions:   PRN: acetaminophen, haloperidol **OR** haloperidol lactate, hydrALAZINE, ondansetron **OR**  ondansetron (ZOFRAN) IV Anti-infectives (From admission, onward)   None      Assessment: 73 year old male admitted with baseline inr 1.7, on warfarin PTA with goal 2-3 for coronary artery disease or peripheral vascular disease. INR is therapeutic this AM (2.1>>3.2>2.3). No bleeding noted. Pt is FTT and AMS, likely inconsistent with home medication regimen and altered effect with poor intake. Home dose of warfarin is 3mg  po daily  Goal of Therapy:  INR 2-3 Monitor platelets by anticoagulation protocol: Yes   Plan:  Coumadin 3mg  x 1 today INR daily Monitor for signs and symptoms of bleeding   Elder Cyphers, BS Loura Back, BCPS Clinical Pharmacist Pager [phone] 07/20/2018,7:31 AM

## 2018-07-20 NOTE — Progress Notes (Signed)
Report called and given to nursing staff at [facility]. EMS present to transport patient.

## 2018-07-20 NOTE — Evaluation (Signed)
Physical Therapy Evaluation Patient Details Name: Marvin Nelson MRN: [id number] DOB: 01/17/1946 Today's Date: 07/20/2018   History of Present Illness  Marvin Nelson is a 73 y.o. 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  This patient presents to the hospital via EMS secondary to altered mental status.  History is obtained to the wife as the patient is unable/unwilling to give history.  The patient was admitted 3/14 secondary to severe a aphasia and altered mental status.  Patient underwent extensive testing including 2 MRIs, CTs angiogram, EEG, echo.  The MRI showed an old stroke.  The patient was started on Seroquel due to combativeness and appeared to be improving, whether due to the Seroquel or spontaneously is unknown.  Patient was discharged on 3/17 and was sent to rehab until 3/25.  On discharge, the patient was walking, talking, eating, and taking his medications.  Patient had a follow-up [facility], who decreased his Seroquel from 25 mg twice daily to 12.5 mg twice daily.  Patient continues to have no problems until 4/7.  He had a follow-up with his primary care physician that day.  Patient went to bed around 5 PM got up at midnight and was incontinent of stool.  [facility] and was evaluated there, as the patient appeared to be mildly confused and unwilling to take his medications.  Patient was evaluated, then sent home, his wife being told that there was nothing wrong with him.  She talked with the PCP on 4/8 who instructed to take him back to the ED.  She did, but took him home after work-up was reportedly negative.    Clinical Impression  Patient limited for functional mobility as stated below secondary to weakness, fatigue and poor standing balance.  Patient  limited to a few unsteady labored steps with near loss of balance to transfer to chair and tolerated sitting up after therapy.  Patient will benefit from continued physical therapy in hospital and recommended venue below to increase strength, balance, endurance for safe ADLs and gait.    Follow Up Recommendations SNF    Equipment Recommendations  None recommended by PT    Recommendations for Other Services       Precautions / Restrictions Precautions Precautions: Fall Restrictions Weight Bearing Restrictions: No      Mobility  Bed Mobility Overal bed mobility: Needs Assistance Bed Mobility: Supine to Sit     Supine to sit: Min assist     General bed mobility comments: labored movement  Transfers Overall transfer level: Needs assistance Equipment used: Rolling walker (2 wheeled) Transfers: Sit to/from [employer] CorporationStand;Stand Pivot Transfers Sit to Stand: Mod assist;Min assist Stand pivot transfers: Mod assist       General transfer comment: unsteady on LLE, labored movement  Ambulation/Gait Ambulation/Gait assistance: Max assist;Mod assist Gait Distance (Feet): 4 Feet Assistive device: Rolling walker (2 wheeled) Gait Pattern/deviations: Decreased step length - left;Decreased stride length Gait velocity: slow   General Gait Details: limited to 4-5 unsteady slow steps during transfer to chair, limited secondary to c/o fatigue  Stairs            Wheelchair Mobility    Modified Rankin (Stroke Patients Only)       Balance Overall balance assessment: Needs assistance Sitting-balance support: Feet supported Sitting balance-Leahy Scale: Fair  Standing balance support: Bilateral upper extremity supported;During functional activity Standing balance-Leahy Scale: Fair Standing balance comment: fair/poor with RW                             Pertinent Vitals/Pain Pain Assessment: No/denies pain    Home Living Family/patient expects to be discharged  to:: Private residence Living Arrangements: Spouse/significant other Available Help at Discharge: Family Type of Home: House Home Access: Ramped entrance     Home Layout: One level Home Equipment: Art gallery manager;Wheelchair - [employer] - 2 wheels;Shower seat      Prior Function Level of Independence: Independent with assistive device(s)         Comments: short distanced household gait with RW, uses electric scooter for longer distances, Right AKA 10 years ago     Hand Dominance        Extremity/Trunk Assessment   Upper Extremity Assessment Upper Extremity Assessment: Generalized weakness    Lower Extremity Assessment Lower Extremity Assessment: Generalized weakness    Cervical / Trunk Assessment Cervical / Trunk Assessment: Normal  Communication   Communication: No difficulties  Cognition Arousal/Alertness: Awake/alert Behavior During Therapy: WFL for tasks assessed/performed Overall Cognitive Status: Within Functional Limits for tasks assessed                                        General Comments      Exercises     Assessment/Plan    PT Assessment Patient needs continued PT services  PT Problem List Decreased strength;Decreased activity tolerance;Decreased balance;Decreased mobility       PT Treatment Interventions Therapeutic exercise;Gait training;Stair training;Functional mobility training;Therapeutic activities;Patient/family education    PT Goals (Current goals can be found in the Care Plan section)  Acute Rehab PT Goals Patient Stated Goal: return home after rehab PT Goal Formulation: With patient Time For Goal Achievement: 08/03/18 Potential to Achieve Goals: Good    Frequency Min 3X/week   Barriers to discharge        Co-evaluation               AM-PAC PT "6 Clicks" Mobility  Outcome Measure Help needed turning from your back to your side while in a flat bed without using bedrails?: A Little Help needed  moving from lying on your back to sitting on the side of a flat bed without using bedrails?: A Lot Help needed moving to and from a bed to a chair (including a wheelchair)?: A Lot Help needed standing up from a chair using your arms (e.g., wheelchair or bedside chair)?: A Lot Help needed to walk in hospital room?: A Lot Help needed climbing 3-5 steps with a railing? : Total 6 Click Score: 12    End of Session   Activity Tolerance: Patient tolerated treatment well;Patient limited by fatigue Patient left: in chair;with call bell/phone within reach;with chair alarm set Nurse Communication: Mobility status PT Visit Diagnosis: Unsteadiness on feet (R26.81);Other abnormalities of gait and mobility (R26.89);Muscle weakness (generalized) (M62.81)    Time: 6979-4801 PT Time Calculation (min) (ACUTE ONLY): 24 min   Charges:   PT Evaluation $PT Eval Moderate Complexity: 1 Mod PT Treatments $Therapeutic Activity: 23-37 mins        10:42 AM, 07/20/18 Ocie Bob, MPT Physical Therapist with [facility] 336 [phone] office [phone] mobile phone

## 2018-07-20 NOTE — NC FL2 (Signed)
[place] MEDICAID FL2 LEVEL OF CARE SCREENING TOOL     IDENTIFICATION  Patient Name: Marvin Nelson Birthdate: 1945-07-09 Sex: male Admission Date (Current Location): 07/17/2018  [place] Number:  Aaron Edelman [id number] O Facility and Address:  [facility],  [address], [place] [address]      Provider Number: [id number]  Attending Physician Name and Address:  Catarina Hartshorn, MD  Relative Name and Phone Number:  Rylee Toole, [phone]    Current Level of Care: Hospital Recommended Level of Care: Skilled Nursing Facility Prior Approval Number:    Date Approved/Denied:   PASRR Number: [id number]  Discharge Plan: SNF    Current Diagnoses: Patient Active Problem List   Diagnosis Date Noted  . Altered mental status 07/19/2018  . Uncontrolled type 2 diabetes mellitus with hyperglycemia (HCC) 07/18/2018  . Encephalopathy acute 07/17/2018  . Failure to thrive in adult 07/17/2018  . Coronary atherosclerosis of native coronary artery   . Essential hypertension, benign   . Type 2 diabetes mellitus (HCC)   . Peripheral vascular disease (HCC)   . Tobacco abuse   . Mixed hyperlipidemia     Orientation RESPIRATION BLADDER Height & Weight     Self, Time, Situation, Place  Normal External catheter Weight: 48.6 kg Height:  5\' 6"  (167.6 cm)  BEHAVIORAL SYMPTOMS/MOOD NEUROLOGICAL BOWEL NUTRITION STATUS      Continent Diet(heart healthy)  AMBULATORY STATUS COMMUNICATION OF NEEDS Skin   Extensive Assist Verbally Skin abrasions(legs)                       Personal Care Assistance Level of Assistance  Bathing, Feeding, Dressing Bathing Assistance: Limited assistance Feeding assistance: Limited assistance Dressing Assistance: Limited assistance     Functional Limitations Info  Sight, Hearing, Speech Sight Info: Adequate Hearing Info: Adequate Speech Info: Adequate    SPECIAL CARE FACTORS FREQUENCY  PT (By licensed PT)     PT Frequency:  5x/week              Contractures Contractures Info: Not present    Additional Factors Info  Code Status, Allergies, Psychotropic(FULL) Code Status Info: Full code Allergies Info: NKA Psychotropic Info: Seroquel         Current Medications (07/20/2018):  This is the current hospital active medication list Current Facility-Administered Medications  Medication Dose Route Frequency Provider Last Rate Last Dose  . acetaminophen (TYLENOL) tablet 650 mg  650 mg Oral Q6H PRN Tat, David, MD      . amitriptyline (ELAVIL) tablet 25 mg  25 mg Oral QHS Levie Heritage, DO   25 mg at 07/19/18 2147  . aspirin EC tablet 81 mg  81 mg Oral Daily Levie Heritage, DO   81 mg at 07/20/18 1008  . atorvastatin (LIPITOR) tablet 80 mg  80 mg Oral q1800 Levie Heritage, DO   80 mg at 07/19/18 1704  . feeding supplement (ENSURE ENLIVE) (ENSURE ENLIVE) liquid 237 mL  237 mL Oral BID BM Levie Heritage, DO   237 mL at 07/20/18 1008  . haloperidol (HALDOL) tablet 2 mg  2 mg Oral Q4H PRN Tat, Onalee Hua, MD       Or  . haloperidol lactate (HALDOL) injection 2 mg  2 mg Intramuscular Q6H PRN Tat, Onalee Hua, MD      . hydrALAZINE (APRESOLINE) injection 5 mg  5 mg Intravenous Q6H PRN Catarina Hartshorn, MD   5 mg at 07/19/18 0541  . insulin aspart (novoLOG)  injection 0-15 Units  0-15 Units Subcutaneous TID WC Catarina Hartshornat, David, MD   11 Units at 07/20/18 1135  . insulin aspart (novoLOG) injection 0-5 Units  0-5 Units Subcutaneous QHS Tat, David, MD      . losartan (COZAAR) tablet 100 mg  100 mg Oral Daily Pearson GrippeKim, James, MD   100 mg at 07/20/18 1008  . [START ON 07/21/2018] magnesium oxide (MAG-OX) tablet 400 mg  400 mg Oral Daily Tat, David, MD      . multivitamin with minerals tablet 1 tablet  1 tablet Oral Daily Tat, David, MD      . Melene Muller[START ON 07/21/2018] nicotine (NICODERM CQ - dosed in mg/24 hours) patch 14 mg  14 mg Transdermal Daily Tat, David, MD      . OLANZapine zydis (ZYPREXA) disintegrating tablet 5 mg  5 mg Oral BID Tat, Onalee Huaavid,  MD   5 mg at 07/20/18 0749  . ondansetron (ZOFRAN) tablet 4 mg  4 mg Oral Q6H PRN Levie HeritageStinson, Jacob J, DO       Or  . ondansetron [facility](ZOFRAN) injection 4 mg  4 mg Intravenous Q6H PRN Levie HeritageStinson, Jacob J, DO      . pregabalin (LYRICA) capsule 150 mg  150 mg Oral BID Catarina Hartshornat, David, MD   150 mg at 07/20/18 1008  . Vitamin D (Ergocalciferol) (DRISDOL) capsule 50,000 Units  50,000 Units Oral Weekly Levie HeritageStinson, Jacob J, DO      . warfarin (COUMADIN) tablet 3 mg  3 mg Oral Once Tat, Onalee Huaavid, MD      . Warfarin - Pharmacist Dosing Inpatient   Does not apply q1800 Coffee, Gerre PebblesGarrett, [facility]         Discharge Medications: Please see discharge summary for a list of discharge medications.  Relevant Imaging Results:  Relevant Lab Results:   Additional Information SSN [id number]; Right AKA (not new)  Jewelle Whitner, Chrystine OilerSharley Diane, RN

## 2018-07-20 NOTE — TOC Initial Note (Signed)
Transition of Care [facility](TOC) - Initial/Assessment Note    Patient Details  Name: Marvin Nelson MRN: [id number] Date of Birth: 05/30/1945  Transition of Care [facility](TOC) CM/SW Contact:    Clementina Mareno, Chrystine OilerSharley Diane, RN Phone Number: 07/20/2018, 12:59 PM  Clinical Narrative:             From home with wife. Uses motorized wheelchair mostly, can walk short distances with RW. Right AKA. [facility] Seen by PT, recommended for SNF. Patient agreeable. Discussed CMS list and options. [facility] and [facility]   Expected Discharge Plan: Skilled Nursing Facility Barriers to Discharge: No Barriers Identified   Patient Goals and CMS Choice Patient states their goals for this hospitalization and ongoing recovery are:: go to rehab and then get home CMS Medicare.gov Compare Post Acute Care list provided to:: Patient Choice offered to / list presented to : Patient  Expected Discharge Plan and Services Expected Discharge Plan: Skilled Nursing Facility   Discharge Planning Services: CM Consult Post Acute Care Choice: Skilled Nursing Facility Living arrangements for the past 2 months: Single Family Home                          Prior Living Arrangements/Services Living arrangements for the past 2 months: Single Family Home Lives with:: Spouse Patient language and need for interpreter reviewed:: Yes Do you feel safe going back to the place where you live?: Yes      Need for Family Participation in Patient Care: Yes (Comment) Care giver support system in place?: Yes (comment)   Criminal Activity/Legal Involvement Pertinent to Current Situation/Hospitalization: No - Comment as needed  Activities of Daily Living Home Assistive Devices/Equipment: Electric scooter, Grab bars in shower, Environmental consultantWalker (specify type) ADL Screening (condition at time of admission) Patient's cognitive ability adequate to safely complete daily activities?: No Is the  patient deaf or have difficulty hearing?: No Does the patient have difficulty seeing, even when wearing glasses/contacts?: No Does the patient have difficulty concentrating, remembering, or making decisions?: No Patient able to express need for assistance with ADLs?: Yes Does the patient have difficulty dressing or bathing?: No Independently performs ADLs?: Yes (appropriate for developmental age) Does the patient have difficulty walking or climbing stairs?: No Weakness of Legs: None Weakness of Arms/Hands: None  Permission Sought/Granted Permission sought to share information with : Case Manager Permission granted to share information with : Yes, Verbal Permission Granted  Share Information with NAME: [facility], [facility]           Emotional Assessment   Attitude/Demeanor/Rapport: Engaged Affect (typically observed): Accepting, Calm Orientation: : Oriented to Self, Oriented to Place, Oriented to  Time, Oriented to Situation Alcohol / Substance Use: Not Applicable Psych Involvement: No (comment)  Admission diagnosis:  Altered mental state [R41.82] Altered mental status, unspecified altered mental status type [R41.82] Patient Active Problem List   Diagnosis Date Noted  . Protein-calorie malnutrition, severe 07/20/2018  . Altered mental status 07/19/2018  . Uncontrolled type 2 diabetes mellitus with hyperglycemia (HCC) 07/18/2018  . Encephalopathy acute 07/17/2018  . Failure to thrive in adult 07/17/2018  . Coronary atherosclerosis of native coronary artery   . Essential hypertension, benign   . Type 2 diabetes mellitus (HCC)   . Peripheral vascular disease (HCC)   . Tobacco abuse   . Mixed hyperlipidemia    PCP:  [name], David B., MD Pharmacy:   [facility] - [place] - [address] [place] [address] Phone: [phone] Fax: [phone]        Readmission Risk Interventions No flowsheet data found.

## 2018-07-20 NOTE — TOC Transition Note (Signed)
Transition of Care [facility]) - CM/SW Discharge Note   Patient Details  Name: Marvin Nelson MRN: [id number] Date of Birth: [birth year]  [facility] CM/SW Contact:  Eston Heslin, Chrystine Oiler, RN Phone Number: 07/20/2018, 1:59 PM   Clinical Narrative:   Patient and wife notified of bed offer from [facility], declined by [facility], and [facility] is requiring Covid testing which can no be done at this time.  Discussed opening up bed search to other facilities,  patient and wife decline. Stating no to [facility] or [facility] would be too far. Discussed that [facility] [place] also requires Covid testing.  Both accept [facility] bed offer. Debbie at [facility] notified, patient will be in A3, bed 1. Will be in room alone for 14 days. Wife updated. Wife will need to sign patient in on front porch and bring clothes to [place], Eunice Blase of [facility] will call wife and go over details also.  Wife reports that patient has a throw blanket that came with him in EMS ride, bedside RN to locate and send with patient.  Bedside RN to put patient in scrubs for transport to [place]. EMS transport arranged.   Final next level of care: Skilled Nursing Facility Barriers to Discharge: No Barriers Identified   Patient Goals and CMS Choice Patient states their goals for this hospitalization and ongoing recovery are:: go to rehab and then get home CMS Medicare.gov Compare Post Acute Care list provided to:: Patient Choice offered to / list presented to : Patient       Discharge Plan and Services   Discharge Planning Services: CM Consult Post Acute Care Choice: Skilled Nursing Facility                         Readmission Risk Interventions No flowsheet data found.

## 2018-07-20 NOTE — Plan of Care (Signed)
  Problem: Acute Rehab PT Goals(only PT should resolve) Goal: Pt Will Go Supine/Side To Sit Outcome: Progressing Flowsheets (Taken 07/20/2018 1043) Pt will go Supine/Side to Sit: with supervision Goal: Patient Will Transfer Sit To/From Stand Outcome: Progressing Flowsheets (Taken 07/20/2018 1043) Patient will transfer sit to/from stand: with minimal assist Goal: Pt Will Transfer Bed To Chair/Chair To Bed Outcome: Progressing Flowsheets (Taken 07/20/2018 1043) Pt will Transfer Bed to Chair/Chair to Bed: with min assist Goal: Pt Will Ambulate Outcome: Progressing Flowsheets (Taken 07/20/2018 1043) Pt will Ambulate: 15 feet; with rolling walker; with standard walker   10:44 AM, 07/20/18 Ocie Bob, MPT Physical Therapist with [facility] 336 [phone] office [phone] mobile phone

## 2019-06-18 ENCOUNTER — Other Ambulatory Visit: Payer: Self-pay | Admitting: Family Medicine

## 2019-06-18 ENCOUNTER — Other Ambulatory Visit (HOSPITAL_COMMUNITY): Payer: Self-pay | Admitting: Family Medicine

## 2019-06-18 DIAGNOSIS — R918 Other nonspecific abnormal finding of lung field: Secondary | ICD-10-CM

## 2019-06-18 DIAGNOSIS — R9389 Abnormal findings on diagnostic imaging of other specified body structures: Secondary | ICD-10-CM

## 2019-06-21 ENCOUNTER — Other Ambulatory Visit: Payer: Self-pay

## 2019-06-21 ENCOUNTER — Ambulatory Visit (HOSPITAL_COMMUNITY)
Admission: RE | Admit: 2019-06-21 | Discharge: 2019-06-21 | Disposition: A | Discharge status: Home / Self Care | Payer: Medicare Other | Source: Ambulatory Visit | Attending: Family Medicine | Admitting: Family Medicine

## 2019-06-21 DIAGNOSIS — R9389 Abnormal findings on diagnostic imaging of other specified body structures: Secondary | ICD-10-CM

## 2019-06-21 DIAGNOSIS — R918 Other nonspecific abnormal finding of lung field: Secondary | ICD-10-CM | POA: Diagnosis present

## 2019-06-21 MED ORDER — FLUDEOXYGLUCOSE F - 18 (FDG) INJECTION
7.5700 | Freq: Once | INTRAVENOUS | Status: AC | PRN
  Administered 2019-06-21: 7.57 via INTRAVENOUS

## 2019-06-29 ENCOUNTER — Encounter: Payer: Self-pay | Admitting: Internal Medicine

## 2019-06-29 ENCOUNTER — Ambulatory Visit (INDEPENDENT_AMBULATORY_CARE_PROVIDER_SITE_OTHER): Payer: Medicare Other | Admitting: Internal Medicine

## 2019-06-29 ENCOUNTER — Other Ambulatory Visit: Payer: Self-pay

## 2019-06-29 VITALS — BP 133/73 | HR 86 | Temp 97.7°F | Ht 66.0 in | Wt 129.0 lb

## 2019-06-29 DIAGNOSIS — R911 Solitary pulmonary nodule: Secondary | ICD-10-CM | POA: Diagnosis not present
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
## 2019-06-29 NOTE — Patient Instructions (Signed)
The patient should have follow up scheduled with myself in 6 months.   Prior to next visit patient should have: CT Chest (ordered) September 2021.

## 2019-12-29 ENCOUNTER — Ambulatory Visit (HOSPITAL_COMMUNITY): Admission: RE | Admit: 2019-12-29 | Payer: Medicare Other | Source: Ambulatory Visit

## 2020-09-16 IMAGING — CR PORTABLE CHEST - 1 VIEW
2 series · 2 of 2 positions shown · non-contrast
Comparison: 07/15/2018

CLINICAL DATA: Altered mental status

EXAM:
PORTABLE CHEST 1 VIEW

[portable (1 of 2)]
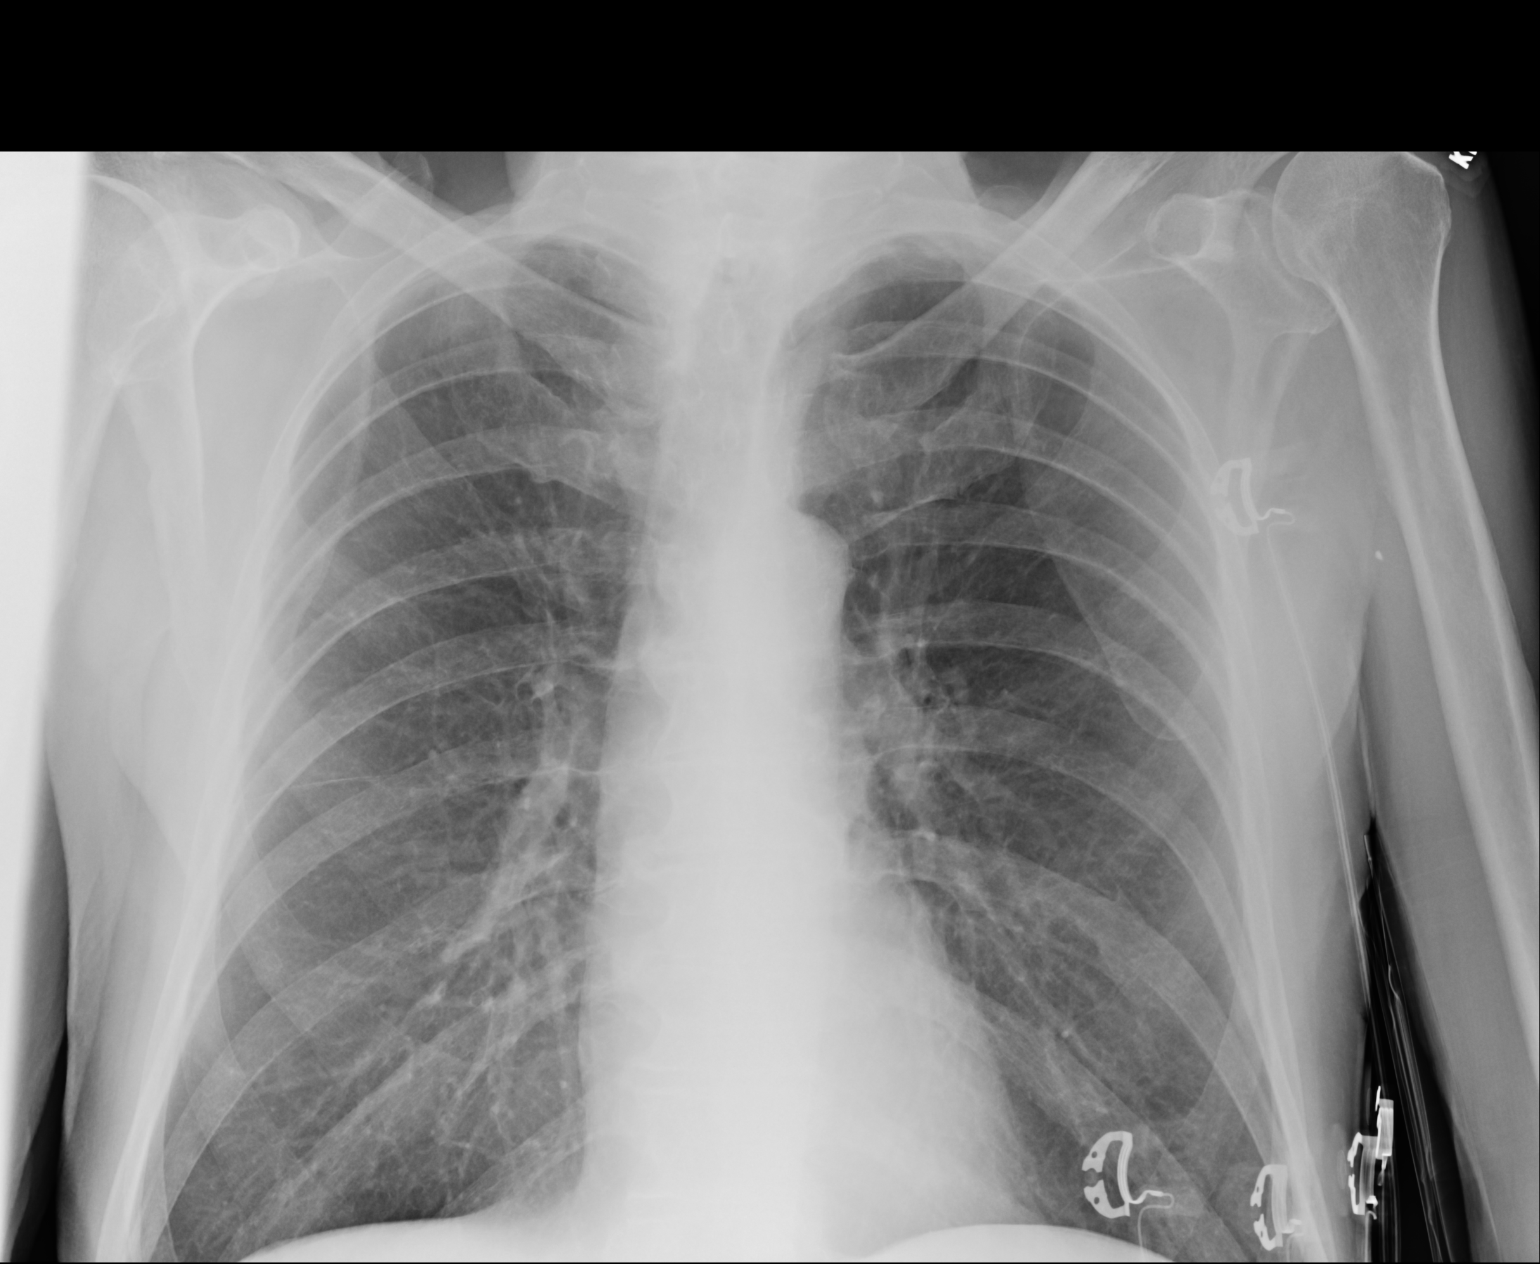

[portable (2 of 2)]
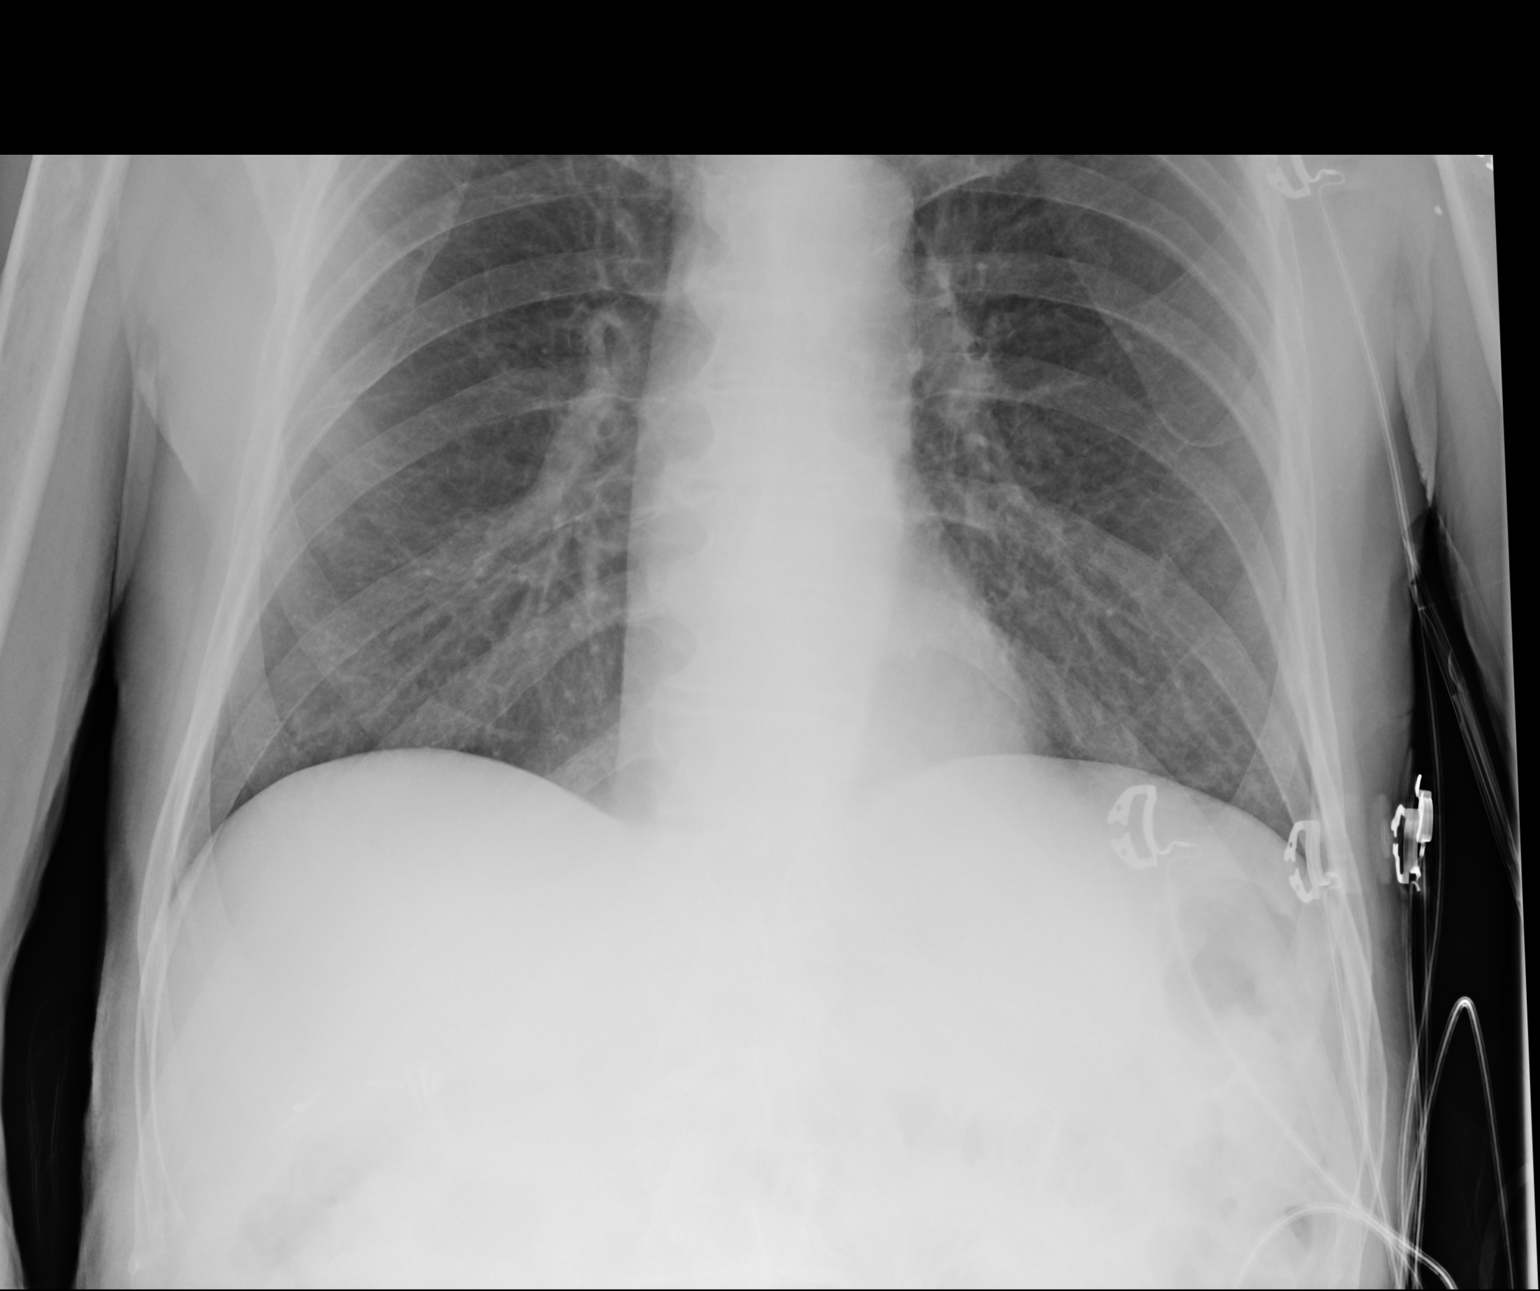

[2 of 2 positions shown; findings below may reference images not displayed]

FINDINGS: The heart size and mediastinal contours are within normal limits.
Both lungs are clear. The visualized skeletal structures are
unremarkable.
IMPRESSION: No active disease.

## 2021-05-09 DEATH — deceased
